# Patient Record
Sex: Female | Born: 1980 | Race: White | Hispanic: No | Marital: Single | Smoking: Never smoker
Health system: Southern US, Community
[De-identification: ages and names within clinical notes are randomized; demographics above are authoritative.]

## PROBLEM LIST (undated history)

## (undated) DIAGNOSIS — E079 Disorder of thyroid, unspecified: Secondary | ICD-10-CM

## (undated) DIAGNOSIS — J302 Other seasonal allergic rhinitis: Secondary | ICD-10-CM

## (undated) DIAGNOSIS — J45909 Unspecified asthma, uncomplicated: Secondary | ICD-10-CM

## (undated) DIAGNOSIS — K219 Gastro-esophageal reflux disease without esophagitis: Secondary | ICD-10-CM

## (undated) DIAGNOSIS — E785 Hyperlipidemia, unspecified: Secondary | ICD-10-CM

## (undated) HISTORY — DX: Gastro-esophageal reflux disease without esophagitis: K21.9

## (undated) HISTORY — DX: Other seasonal allergic rhinitis: J30.2

## (undated) HISTORY — PX: MASTOIDECTOMY: SHX711

## (undated) HISTORY — DX: Disorder of thyroid, unspecified: E07.9

## (undated) HISTORY — DX: Hyperlipidemia, unspecified: E78.5

---
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
## 2012-07-08 LAB — CBC AND DIFFERENTIAL
HCT: 40 (ref 36–46)
Hemoglobin: 13.4 (ref 12.0–16.0)
Platelets: 167 (ref 150–399)

## 2012-07-08 LAB — LIPID PANEL: CHOLESTEROL: 214 — AB (ref 0–200)

## 2012-07-08 LAB — BASIC METABOLIC PANEL
Glucose: 77
POTASSIUM: 4.2 (ref 3.4–5.3)
Sodium: 141 (ref 137–147)

## 2012-07-08 LAB — HEPATIC FUNCTION PANEL
ALK PHOS: 61 (ref 25–125)
ALT: 96 — AB (ref 7–35)
AST: 40 — AB (ref 13–35)
Alkaline Phosphatase: 61 (ref 25–125)

## 2012-07-08 LAB — TSH: TSH: 1.56 (ref <=5.90)

## 2012-08-09 LAB — HM PAP SMEAR

## 2017-08-27 DIAGNOSIS — Z3042 Encounter for surveillance of injectable contraceptive: Secondary | ICD-10-CM | POA: Diagnosis not present

## 2017-10-29 ENCOUNTER — Ambulatory Visit: Payer: Self-pay | Admitting: Family Medicine

## 2017-11-14 DIAGNOSIS — Z3042 Encounter for surveillance of injectable contraceptive: Secondary | ICD-10-CM | POA: Diagnosis not present

## 2017-11-14 DIAGNOSIS — H66002 Acute suppurative otitis media without spontaneous rupture of ear drum, left ear: Secondary | ICD-10-CM | POA: Diagnosis not present

## 2017-12-22 DIAGNOSIS — J029 Acute pharyngitis, unspecified: Secondary | ICD-10-CM | POA: Diagnosis not present

## 2018-01-11 ENCOUNTER — Ambulatory Visit (HOSPITAL_COMMUNITY)
Admission: EM | Admit: 2018-01-11 | Discharge: 2018-01-11 | Disposition: A | Discharge status: Home / Self Care | Payer: BLUE CROSS/BLUE SHIELD | Attending: Family Medicine | Admitting: Family Medicine

## 2018-01-11 ENCOUNTER — Encounter (HOSPITAL_COMMUNITY): Payer: Self-pay | Admitting: Family Medicine

## 2018-01-11 DIAGNOSIS — J0100 Acute maxillary sinusitis, unspecified: Secondary | ICD-10-CM

## 2018-01-11 HISTORY — DX: Unspecified asthma, uncomplicated: J45.909

## 2018-01-11 MED ORDER — FLUCONAZOLE 150 MG PO TABS: 150.0000 mg | ORAL_TABLET | Freq: Every day | ORAL | 0 refills | Status: AC

## 2018-01-11 MED ORDER — AMOXICILLIN-POT CLAVULANATE 875-125 MG PO TABS: 1.0000 | ORAL_TABLET | Freq: Two times a day (BID) | ORAL | 0 refills | Status: AC

## 2018-01-11 NOTE — ED Provider Notes (Signed)
[facility]    CSN: [id number] Arrival date & time: 01/11/18  1148     History   Chief Complaint Chief Complaint  Patient presents with  . Sore Throat  . Otalgia    HPI Denise Keller is a 37 y.o. female.   37 y.o. Female, presenting today for 2 weeks duration of coughing, congestion, bilateral otalgia, sore throat and sinus pain. She was improving but got worse again. Denies fever, CP, SOB, wheezing or abdominal pain. Reports that she has really bad allergic rhinitis. She also states that she is prone to get ear infections.       Past Medical History:  Diagnosis Date  . Asthma     There are no active problems to display for this patient.   History reviewed. No pertinent surgical history.  OB History    No data available       Home Medications    Prior to Admission medications   Medication Sig Start Date End Date Taking? Authorizing Provider  amoxicillin-clavulanate (AUGMENTIN) 875-125 MG tablet Take 1 tablet by mouth 2 (two) times daily for 7 days. 01/11/18 01/18/18  Lucia EstelleZheng, Cambell Stanek, NP  fluconazole (DIFLUCAN) 150 MG tablet Take 1 tablet (150 mg total) by mouth daily for 2 days. 01/11/18 01/13/18  Lucia EstelleZheng, Demiyah Fischbach, NP    Family History History reviewed. No pertinent family history.  Social History Social History   Tobacco Use  . Smoking status: Never Smoker  . Smokeless tobacco: Never Used  Substance Use Topics  . Alcohol use: Not on file  . Drug use: Not on file     Allergies   Biaxin [clarithromycin] and Eggs or egg-derived products   Review of Systems Review of Systems  Constitutional: Negative for chills, fatigue and fever.  HENT: Positive for congestion, ear pain, rhinorrhea, sinus pressure, sinus pain, sneezing and sore throat.   Respiratory: Positive for cough. Negative for shortness of breath and wheezing.   Cardiovascular: Negative for chest pain and palpitations.  Gastrointestinal: Negative for abdominal pain, nausea and vomiting.    Neurological: Negative for dizziness and headaches.     Physical Exam Triage Vital Signs ED Triage Vitals  Enc Vitals Group     BP 01/11/18 1253 138/77     Pulse Rate 01/11/18 1253 98     Resp 01/11/18 1253 18     Temp 01/11/18 1253 98.6 F (37 C)     Temp src --      SpO2 01/11/18 1253 100 %     Weight --      Height --      Head Circumference --      Peak Flow --      Pain Score 01/11/18 1249 5     Pain Loc --      Pain Edu? --      Excl. in GC? --    No data found.  Updated Vital Signs BP 138/77   Pulse 98   Temp 98.6 F (37 C)   Resp 18   SpO2 100%   Visual Acuity Right Eye Distance:   Left Eye Distance:   Bilateral Distance:    Right Eye Near:   Left Eye Near:    Bilateral Near:     Physical Exam  Constitutional: She is oriented to person, place, and time. She appears well-developed and well-nourished. No distress.  HENT:  Head: Normocephalic and atraumatic.  Mouth/Throat: Mucous membranes are normal.  Left Ear: Canal clear, TM perforation noted but no  erythema.  Right ear: Unremarkable.  Maxillary sinuses tender to percuss.  Pharyngeal erythema noted  Neck: Normal range of motion. Neck supple.  Cardiovascular: Normal rate and normal heart sounds.  No murmur heard. Pulmonary/Chest: Effort normal and breath sounds normal. She has no wheezes.  Abdominal: Soft. There is no tenderness.  Lymphadenopathy:    She has no cervical adenopathy.  Neurological: She is alert and oriented to person, place, and time.  Skin: Skin is warm and dry.  Psychiatric: She has a normal mood and affect.  Nursing note and vitals reviewed.    UC Treatments / Results  Labs (all labs ordered are listed, but only abnormal results are displayed) Labs Reviewed - No data to display  EKG  EKG Interpretation None       Radiology No results found.  Procedures Procedures (including critical care time)  Medications Ordered in UC Medications - No data to  display   Initial Impression / Assessment and Plan / UC Course  I have reviewed the triage vital signs and the nursing notes.  Pertinent labs & imaging results that were available during my care of the patient were reviewed by me and considered in my medical decision making (see chart for details).  Final Clinical Impressions(s) / UC Diagnoses   Final diagnoses:  Acute non-recurrent maxillary sinusitis   Prescriptions given (see below). Also continue the zyrtec and Flonase that you have at home. Reviewed directions for usage and side effects. Patient states understanding and will call with questions or problems. Patient instructed to call or follow up with his/her primary care doctor if failure to improve or change in symptoms. Discharge instruction given.   ED Discharge Orders        Ordered    amoxicillin-clavulanate (AUGMENTIN) 875-125 MG tablet  2 times daily     01/11/18 1308    fluconazole (DIFLUCAN) 150 MG tablet  Daily    Comments:  Take 1 now, repeat in 72 hrs   01/11/18 1308       Controlled Substance Prescriptions [place] Controlled Substance Registry consulted? Not Applicable   Lucia Estelle, NP 01/11/18 1313

## 2018-01-11 NOTE — ED Triage Notes (Signed)
Pt here for sore throat, cough, congestion since before christmas. sts that she was seen at [facility] after christmas and told virus. sts that she got slightly better but then her symptoms returned.

## 2018-02-04 DIAGNOSIS — Z3042 Encounter for surveillance of injectable contraceptive: Secondary | ICD-10-CM | POA: Diagnosis not present

## 2018-03-25 DIAGNOSIS — H40003 Preglaucoma, unspecified, bilateral: Secondary | ICD-10-CM | POA: Diagnosis not present

## 2018-04-22 DIAGNOSIS — Z3042 Encounter for surveillance of injectable contraceptive: Secondary | ICD-10-CM | POA: Diagnosis not present

## 2018-05-02 ENCOUNTER — Ambulatory Visit (INDEPENDENT_AMBULATORY_CARE_PROVIDER_SITE_OTHER): Payer: BLUE CROSS/BLUE SHIELD

## 2018-05-02 ENCOUNTER — Ambulatory Visit (INDEPENDENT_AMBULATORY_CARE_PROVIDER_SITE_OTHER): Payer: BLUE CROSS/BLUE SHIELD | Admitting: Podiatry

## 2018-05-02 ENCOUNTER — Encounter: Payer: Self-pay | Admitting: Podiatry

## 2018-05-02 DIAGNOSIS — M722 Plantar fascial fibromatosis: Secondary | ICD-10-CM | POA: Diagnosis not present

## 2018-05-02 DIAGNOSIS — M79671 Pain in right foot: Secondary | ICD-10-CM

## 2018-05-02 DIAGNOSIS — M79672 Pain in left foot: Secondary | ICD-10-CM

## 2018-05-02 MED ORDER — MELOXICAM 15 MG PO TABS: 15.0000 mg | ORAL_TABLET | Freq: Every day | ORAL | 0 refills | Status: DC

## 2018-05-02 NOTE — Progress Notes (Signed)
Subjective:   Patient ID: Denise Keller, female   DOB: 37 y.o.   MRN: [id number]   HPI 37 year old female presents the office today for concerns of continued left foot pain.  She states that she is had about 3 to 4 injections of the last 2 years.  She previously had one injection of the right foot and this did well and had no pain afterwards but she still continues some intermittent pain to the left foot.  She denies any recent injury or trauma she denies any swelling or redness.  She has braces at home she is had previously but she has not been wearing them.She has no other concerns today.   Review of Systems  All other systems reviewed and are negative.   Past Medical History:  Diagnosis Date  . Asthma     History reviewed. No pertinent surgical history.   Current Outpatient Medications:  .  azelastine (ASTELIN) 0.1 % nasal spray, , Disp: , Rfl:  .  medroxyPROGESTERone Acetate 150 MG/ML SUSY, , Disp: , Rfl:  .  meloxicam (MOBIC) 15 MG tablet, Take 1 tablet (15 mg total) by mouth daily., Disp: 30 tablet, Rfl: 0  Allergies  Allergen Reactions  . Biaxin [Clarithromycin]   . Eggs Or Egg-Derived Products     Social History   Socioeconomic History  . Marital status: Single    Spouse name: Not on file  . Number of children: Not on file  . Years of education: Not on file  . Highest education level: Not on file  Occupational History  . Not on file  Social Needs  . Financial resource strain: Not on file  . Food insecurity:    Worry: Not on file    Inability: Not on file  . Transportation needs:    Medical: Not on file    Non-medical: Not on file  Tobacco Use  . Smoking status: Never Smoker  . Smokeless tobacco: Never Used  Substance and Sexual Activity  . Alcohol use: Not on file  . Drug use: Not on file  . Sexual activity: Not on file  Lifestyle  . Physical activity:    Days per week: Not on file    Minutes per session: Not on file  . Stress: Not on file   Relationships  . Social connections:    Talks on phone: Not on file    Gets together: Not on file    Attends religious service: Not on file    Active member of club or organization: Not on file    Attends meetings of clubs or organizations: Not on file    Relationship status: Not on file  . Intimate partner violence:    Fear of current or ex partner: Not on file    Emotionally abused: Not on file    Physically abused: Not on file    Forced sexual activity: Not on file  Other Topics Concern  . Not on file  Social History Narrative  . Not on file         Objective:  Physical Exam  General: AAO x3, NAD  Dermatological: Skin is warm, dry and supple bilateral. Nails x 10 are well manicured; remaining integument appears unremarkable at this time. There are no open sores, no preulcerative lesions, no rash or signs of infection present.  Vascular: Dorsalis Pedis artery and Posterior Tibial artery pedal pulses are 2/4 bilateral with immedate capillary fill time. Pedal hair growth present. No varicosities and no lower extremity edema  present bilateral. There is no pain with calf compression, swelling, warmth, erythema.   Neruologic: Grossly intact via light touch bilateral.  Protective threshold with Semmes Wienstein monofilament intact to all pedal sites bilateral.  Negative Tinel sign.  Musculoskeletal: Tenderness to palpation along the plantar medial tubercle of the calcaneus at the insertion of plantar fascia on the left foot. There is no pain along the course of the plantar fascia within the arch of the foot. Plantar fascia appears to be intact. There is no pain with lateral compression of the calcaneus or pain with vibratory sensation. There is no pain along the course or insertion of the achilles tendon. No other areas of tenderness to bilateral lower extremities. Muscular strength 5/5 in all groups tested bilateral.  Gait: Unassisted, Nonantalgic.      Assessment:   Left foot  plantar fasciitis    Plan:  -Treatment options discussed including all alternatives, risks, and complications -Etiology of symptoms were discussed -X-rays were obtained and reviewed with the patient. No evidence of acute fracture or stress fracture. -Plantar fascial injection was performed.  See procedure note below. -Prescribed mobic. Discussed side effects of the medication and directed to stop if any are to occur and call the office.  -Plantar fascial brace dispensed -Discussed shoe gear modifications and orthotics -Stretching, icing daily. -She has a night splint at home continue with this. -Follow-up 3 weeks or sooner if needed  Vivi Barrack DPM

## 2018-05-18 ENCOUNTER — Ambulatory Visit: Payer: Self-pay | Admitting: Family Medicine

## 2018-05-27 ENCOUNTER — Ambulatory Visit (INDEPENDENT_AMBULATORY_CARE_PROVIDER_SITE_OTHER): Payer: BLUE CROSS/BLUE SHIELD | Admitting: Podiatry

## 2018-05-27 DIAGNOSIS — M722 Plantar fascial fibromatosis: Secondary | ICD-10-CM | POA: Diagnosis not present

## 2018-05-27 MED ORDER — MELOXICAM 15 MG PO TABS: 15.0000 mg | ORAL_TABLET | Freq: Every day | ORAL | 0 refills | Status: DC

## 2018-05-27 MED ORDER — TRIAMCINOLONE ACETONIDE 10 MG/ML IJ SUSP
10.0000 mg | Freq: Once | INTRAMUSCULAR | Status: AC
  Administered 2018-05-27: 10 mg

## 2018-05-27 NOTE — Patient Instructions (Signed)

## 2018-05-30 NOTE — Progress Notes (Signed)
Subjective: [name] presents the office today for follow-up evaluation of plantar fasciitis, heel pain left side.  She states that she is still having discomfort on the left heel.  She has some mild improvement with the injection.  She was wearing a plantar fascial brace but is causing some discomfort so she stopped wearing it.  She has 2 night splint at home but she does not like to wear them as it causes her toes to command when she wears them for long periods.  No numbness or tingling.  Pain does not wake her up at night.  No swelling. Denies any systemic complaints such as fevers, chills, nausea, vomiting. No acute changes since last appointment, and no other complaints at this time.   Objective: AAO x3, NAD DP/PT pulses palpable bilaterally, CRT less than 3 seconds Negative Tinel sign There is improved yet continued tenderness to palpation along the plantar medial tubercle of the calcaneus at the insertion of plantar fascia on the left foot. There is no pain along the course of the plantar fascia within the arch of the foot. Plantar fascia appears to be intact. There is no pain with lateral compression of the calcaneus or pain with vibratory sensation. There is no pain along the course or insertion of the achilles tendon. No other areas of tenderness to bilateral lower extremities. No open lesions or pre-ulcerative lesions.  No pain with calf compression, swelling, warmth, erythema  Assessment: Left chronic plantar fasciitis  Plan: -All treatment options discussed with the patient including all alternatives, risks, complications.  -Steroid injection was performed today.  We discussed how with a plantar fascial brace and to different locations for this will help.  She does not want to wear orthotics.  Continue stretching, icing exercises daily.  We discussed further options including PT, EPAT.  -RTC 4 weeks or sooner if needed. -Patient encouraged to call the office with any questions, concerns,  change in symptoms.   Procedure: Injection Tendon/Ligament Discussed alternatives, risks, complications and verbal consent was obtained.  Location: Right plantar fascia at the glabrous junction; medial approach. Skin Prep: Alcohol. Injectate: 0.5cc 0.5% marcaine plain, 0.5 cc 2% lidocaine plain and, 1 cc kenalog 10. Disposition: Patient tolerated procedure well. Injection site dressed with a band-aid.  Post-injection care was discussed and return precautions discussed.    Vivi BarrackMatthew R Wagoner DPM

## 2018-06-24 ENCOUNTER — Ambulatory Visit: Payer: BLUE CROSS/BLUE SHIELD | Admitting: Podiatry

## 2018-06-24 DIAGNOSIS — H40013 Open angle with borderline findings, low risk, bilateral: Secondary | ICD-10-CM | POA: Diagnosis not present

## 2018-07-08 DIAGNOSIS — Z3042 Encounter for surveillance of injectable contraceptive: Secondary | ICD-10-CM | POA: Diagnosis not present

## 2018-07-22 ENCOUNTER — Ambulatory Visit (INDEPENDENT_AMBULATORY_CARE_PROVIDER_SITE_OTHER): Payer: BLUE CROSS/BLUE SHIELD | Admitting: Podiatry

## 2018-07-22 ENCOUNTER — Telehealth: Payer: Self-pay | Admitting: *Deleted

## 2018-07-22 DIAGNOSIS — M722 Plantar fascial fibromatosis: Secondary | ICD-10-CM | POA: Diagnosis not present

## 2018-07-22 NOTE — Telephone Encounter (Signed)
-----   Message from Vivi BarrackMatthew R Wagoner, DPM sent at 07/22/2018  3:50 PM EDT ----- Can you please order an MRI of the left ankle to rule out plantar fascial tear. She has had chronic heel pain and has had several injection, stretching, oral medications without any significant relief.

## 2018-07-26 NOTE — Progress Notes (Signed)
Subjective: 37 year old female presents the office today for follow-up evaluation of left foot pain, plantar fasciitis.  She had plantar fasciitis for several years has had numerous injections.  She states the first injections were helpful but it started not be helpful.  She has been doing stretching exercises as well as using a plantar fascial brace.  She does not want to wear orthotics.  She states that she has sensory issues when she is tried wearing them previously is not uncomfortable.  She still relates intermittent discomfort. Denies any systemic complaints such as fevers, chills, nausea, vomiting. No acute changes since last appointment, and no other complaints at this time.   Objective: AAO x3, NAD DP/PT pulses palpable bilaterally, CRT less than 3 seconds There is continuation of tenderness to the plantar medial tubercle of the calcaneus and insertion of plantar fascia on the left foot.  There is no pain with lateral compression of calcaneus there is no pain to vibratory sensation.  No edema, erythema, increased warmth.  Achilles tendon, flexor, peroneal tendons intact.  No open lesions or pre-ulcerative lesions.  No pain with calf compression, swelling, warmth, erythema  Assessment: Chronic heel pain, plantar fasciitis  Plan: -All treatment options discussed with the patient including all alternatives, risks, complications.  -At this time she is had several injections therefore do not think we should continue with injections.  At this point I recommend an MRI to rule out a plantar fascial tear this is ordered today.  This is also for potential surgical planning.  Continue stretching, icing exercises at home for now.  I will see her back after the MRI or sooner if any issues are to arise.  Discussed further treatment options including physical therapy, EPAT, surgery as well.  -Patient encouraged to call the office with any questions, concerns, change in symptoms.   Vivi BarrackMatthew R Royale Swamy  DPM

## 2018-08-05 ENCOUNTER — Ambulatory Visit
Admission: RE | Admit: 2018-08-05 | Discharge: 2018-08-05 | Disposition: A | Discharge status: Home / Self Care | Payer: BLUE CROSS/BLUE SHIELD | Source: Ambulatory Visit | Attending: Podiatry | Admitting: Podiatry

## 2018-08-05 DIAGNOSIS — R600 Localized edema: Secondary | ICD-10-CM | POA: Diagnosis not present

## 2018-08-09 ENCOUNTER — Telehealth: Payer: Self-pay

## 2018-08-09 NOTE — Telephone Encounter (Signed)
-----   Message from Vivi BarrackMatthew R Wagoner, DPM sent at 08/09/2018  7:29 AM EDT ----- Shanda BumpsJessica- will you please let her know that the MRI shoes moderate plantar fasciitis. We have discussed EPAT and PT. Will you call her to let her know the MRI results and see if she wants to start this? Thanks.

## 2018-08-09 NOTE — Telephone Encounter (Signed)
Left voicemail for patient to return my phone call regarding MRI results.

## 2018-08-10 ENCOUNTER — Telehealth: Payer: Self-pay

## 2018-08-10 ENCOUNTER — Ambulatory Visit (INDEPENDENT_AMBULATORY_CARE_PROVIDER_SITE_OTHER): Payer: BLUE CROSS/BLUE SHIELD | Admitting: Family Medicine

## 2018-08-10 ENCOUNTER — Encounter: Payer: Self-pay | Admitting: Family Medicine

## 2018-08-10 VITALS — BP 124/86 | HR 89 | Temp 98.0°F | Ht 62.5 in | Wt 256.4 lb

## 2018-08-10 DIAGNOSIS — E282 Polycystic ovarian syndrome: Secondary | ICD-10-CM | POA: Diagnosis not present

## 2018-08-10 DIAGNOSIS — Z13220 Encounter for screening for lipoid disorders: Secondary | ICD-10-CM

## 2018-08-10 DIAGNOSIS — Z8639 Personal history of other endocrine, nutritional and metabolic disease: Secondary | ICD-10-CM

## 2018-08-10 DIAGNOSIS — R739 Hyperglycemia, unspecified: Secondary | ICD-10-CM

## 2018-08-10 DIAGNOSIS — Z23 Encounter for immunization: Secondary | ICD-10-CM

## 2018-08-10 DIAGNOSIS — Z114 Encounter for screening for human immunodeficiency virus [HIV]: Secondary | ICD-10-CM | POA: Diagnosis not present

## 2018-08-10 DIAGNOSIS — E8881 Metabolic syndrome: Secondary | ICD-10-CM | POA: Diagnosis not present

## 2018-08-10 DIAGNOSIS — M722 Plantar fascial fibromatosis: Secondary | ICD-10-CM

## 2018-08-10 DIAGNOSIS — R5383 Other fatigue: Secondary | ICD-10-CM

## 2018-08-10 DIAGNOSIS — R600 Localized edema: Secondary | ICD-10-CM

## 2018-08-10 DIAGNOSIS — L680 Hirsutism: Secondary | ICD-10-CM

## 2018-08-10 LAB — COMPREHENSIVE METABOLIC PANEL
ALT: 40 U/L — ABNORMAL HIGH (ref 0–35)
AST: 22 U/L (ref 0–37)
Albumin: 3.8 g/dL (ref 3.5–5.2)
Alkaline Phosphatase: 52 U/L (ref 39–117)
BUN: 10 mg/dL (ref 6–23)
CO2: 27 mEq/L (ref 19–32)
Calcium: 9.2 mg/dL (ref 8.4–10.5)
Chloride: 106 mEq/L (ref 96–112)
Creatinine, Ser: 0.77 mg/dL (ref 0.40–1.20)
GFR: 89.03 mL/min (ref >60.00)
Glucose, Bld: 92 mg/dL (ref 70–99)
Potassium: 4.2 mEq/L (ref 3.5–5.1)
Sodium: 140 mEq/L (ref 135–145)
Total Bilirubin: 1.3 mg/dL — ABNORMAL HIGH (ref 0.2–1.2)
Total Protein: 6 g/dL (ref 6.0–8.3)

## 2018-08-10 LAB — CBC WITH DIFFERENTIAL/PLATELET
Basophils Absolute: 0 10*3/uL (ref 0.0–0.1)
Basophils Relative: 0.4 % (ref 0.0–3.0)
Eosinophils Absolute: 0.1 10*3/uL (ref 0.0–0.7)
Eosinophils Relative: 1 % (ref 0.0–5.0)
HCT: 42.1 % (ref 36.0–46.0)
Hemoglobin: 14.7 g/dL (ref 12.0–15.0)
Lymphocytes Relative: 28.9 % (ref 12.0–46.0)
Lymphs Abs: 1.8 10*3/uL (ref 0.7–4.0)
MCHC: 34.8 g/dL (ref 30.0–36.0)
MCV: 89.4 fl (ref 78.0–100.0)
Monocytes Absolute: 0.5 10*3/uL (ref 0.1–1.0)
Monocytes Relative: 7.5 % (ref 3.0–12.0)
Neutro Abs: 3.9 10*3/uL (ref 1.4–7.7)
Neutrophils Relative %: 62.2 % (ref 43.0–77.0)
Platelets: 167 10*3/uL (ref 150.0–400.0)
RBC: 4.72 Mil/uL (ref 3.87–5.11)
RDW: 13.5 % (ref 11.5–15.5)
WBC: 6.3 10*3/uL (ref 4.0–10.5)

## 2018-08-10 LAB — LIPID PANEL
Cholesterol: 183 mg/dL (ref 0–200)
HDL: 36.3 mg/dL — ABNORMAL LOW (ref >39.00)
NonHDL: 147.17
Total CHOL/HDL Ratio: 5
Triglycerides: 272 mg/dL — ABNORMAL HIGH (ref 0.0–149.0)
VLDL: 54.4 mg/dL — ABNORMAL HIGH (ref 0.0–40.0)

## 2018-08-10 LAB — LDL CHOLESTEROL, DIRECT: Direct LDL: 129 mg/dL

## 2018-08-10 LAB — T4, FREE: Free T4: 1 ng/dL (ref 0.60–1.60)

## 2018-08-10 LAB — VITAMIN D 25 HYDROXY (VIT D DEFICIENCY, FRACTURES): VITD: 14.4 ng/mL — ABNORMAL LOW (ref 30.00–100.00)

## 2018-08-10 LAB — VITAMIN B12: Vitamin B-12: 201 pg/mL — ABNORMAL LOW (ref 211–911)

## 2018-08-10 LAB — HEMOGLOBIN A1C: Hgb A1c MFr Bld: 5.3 % (ref 4.6–6.5)

## 2018-08-10 LAB — TSH: TSH: 1.76 u[IU]/mL (ref 0.35–4.50)

## 2018-08-10 MED ORDER — SPIRONOLACTONE 50 MG PO TABS: 50.0000 mg | ORAL_TABLET | Freq: Every day | ORAL | 1 refills | Status: DC

## 2018-08-10 MED ORDER — AZELASTINE HCL 0.1 % NA SOLN: 2.0000 | Freq: Two times a day (BID) | NASAL | 2 refills | Status: AC

## 2018-08-10 NOTE — Progress Notes (Signed)
Denise Keller is a 37 y.o. [facility]   Patient Care Team: Helane RimaWallace, Kleo Dungee, DO as PCP - General (Family Medicine)   History of Present Illness:   Denise Keller CMA acting as scribe for Dr. Earlene PlaterWallace.  HPI: Patient comes in today to establish care with Dr. Earlene PlaterWallace. [place] is from [place] in the next year. Patient does not like to search for doctors. We will get labs today and have patient come back in next Friday for a physical.   PCOS: Patient has a history of PCOS. Patient has facial hair that she would like to get rid of. Dicussed patient starting on spirolactone. Patient has some edema in the lower legs today.   Asthma: When patient gave her plasma the nurse was listening to her lungs and heard wheezing. Patient does have asthma and takes rescue inhaler. She was prescribe the preventive inhaler, but she can't afford it.   Plasma: Patients donates plasma twice a week. When she gives the plasma her heart rate runs about in the 90's.   Ear: Patient stated that she has a lot of ear issues. Ears looks good today upon exam.   Thyroid: Patient has had nodules and cyst in the thyroid. Her last US of the thyroid back in 2013.  Health Maintenance Due  Topic Date Due  . HIV Screening  04/23/1995  . PAP SMEAR  04/22/2001  . INFLUENZA VACCINE  07/28/2018   Depression screen PHQ 2/9 08/10/2018  Decreased Interest 0  Down, Depressed, Hopeless 0  PHQ - 2 Score 0   PMHx, SurgHx, SocialHx, Medications, and Allergies were reviewed in the Visit Navigator and updated as appropriate.   Past Medical History:  Diagnosis Date  . Asthma   . GERD (gastroesophageal reflux disease)   . Hyperlipidemia   . Seasonal allergies   . Thyroid disease    Past Surgical History:  Procedure Laterality Date  . TONSILLECTOMY  2000   Family History  Problem Relation Age of Onset  . Arthritis Mother   .  Hyperlipidemia Mother   . Hypertension Mother   . Learning disabilities Mother   . Alcohol abuse Father   . COPD Father   . Heart disease Father   . Hyperlipidemia Father   . Hypertension Father   . Arthritis Sister   . Miscarriages / Stillbirths Sister   . Arthritis Maternal Grandmother   . Cancer Maternal Grandmother   . Diabetes Maternal Grandmother   . Learning disabilities Maternal Grandmother   . Stroke Maternal Grandmother   . Early death Maternal Grandfather   . Heart disease Maternal Grandfather    Social History   Tobacco Use  . Smoking status: Never Smoker  . Smokeless tobacco: Never Used  Substance Use Topics  . Alcohol use: Not on file  . Drug use: Not on file   Current Medications and Allergies:   .  albuterol (PROVENTIL HFA;VENTOLIN HFA) 108 (90 Base) MCG/ACT inhaler, Inhale q 6 (six) hours as needed for wheezing. Marland Kitchen.  azelastine (ASTELIN) 0.1 % nasal spray, Place 2 sprays into both nostrils 2 (two) times daily., Disp: 30 mL, Rfl: 2 .  Cholecalciferol (VITAMIN D3) 1000 units CAPS, Take by mouth., Disp: , Rfl:  .  Ferrous Gluconate-C-Folic Acid (IRON-C PO), Take 65 mg by mouth., Disp: , Rfl:  .  Flaxseed, Linseed, (FLAXSEED OIL PO), Take by mouth., Disp: , Rfl:  .  fluticasone (FLONASE) 50 MCG/ACT nasal spray, Place into both nostrils daily., Disp: , Rfl:  .  loratadine (CLARITIN) 10 MG tablet, Take 10 mg by mouth daily., Disp: , Rfl:  .  medroxyPROGESTERone Acetate 150 MG/ML SUSY, , Disp: , Rfl:  .  Multiple Vitamin (MULTIVITAMIN) tablet, Take 1 tablet by mouth daily., Disp: , Rfl:  .   Allergies  Allergen Reactions  . Biaxin [Clarithromycin]   . Eggs Or Egg-Derived Products    Review of Systems:   Pertinent items are noted in the HPI. Otherwise, ROS is negative.  Vitals:   Vitals:   08/10/18 0755  BP: 124/86  Pulse: 89  Temp: 98 F (36.7 C)  TempSrc: Oral  SpO2: 100%  Weight: 256 lb 6.4 oz (116.3 kg)  Height: 5' 2.5" (1.588 m)     Body mass  index is 46.15 kg/m.  Physical Exam:   Physical Exam  Constitutional: She appears well-nourished.  HENT:  Head: Normocephalic and atraumatic.  Eyes: Pupils are equal, round, and reactive to light. EOM are normal.  Neck: Normal range of motion. Neck supple.  Cardiovascular: Normal rate, regular rhythm, normal heart sounds and intact distal pulses.  Pulmonary/Chest: Effort normal.  Abdominal: Soft.  Musculoskeletal: She exhibits edema.  Skin: Skin is warm.  Facial hair at chin.  Psychiatric: She has a normal mood and affect. Her behavior is normal.  Nursing note and vitals reviewed.  Assessment and Plan:   [name] was seen today for establish care.  Diagnoses and all orders for this visit:  Insulin resistance -     Hemoglobin A1c  PCOS (polycystic ovarian syndrome) -     CBC with Differential/Platelet -     Comprehensive metabolic panel  Fatigue, unspecified type -     TSH -     T4, free -     Vitamin B12 -     VITAMIN D 25 Hydroxy (Vit-D Deficiency, Fractures)  Lipid screening -     Lipid panel  Screening for HIV without presence of risk factors -     HIV antibody  History of thyroid nodule -     US THYROID; Future  Need for prophylactic vaccination against diphtheria-tetanus-pertussis (DTP) -     Tdap vaccine greater than or equal to 7yo IM  Hirsutism -     spironolactone (ALDACTONE) 50 MG tablet; Take 1 tablet (50 mg total) by mouth daily.  Lower extremity edema -     spironolactone (ALDACTONE) 50 MG tablet; Take 1 tablet (50 mg total) by mouth daily.  Other orders -     azelastine (ASTELIN) 0.1 % nasal spray; Place 2 sprays into both nostrils 2 (two) times daily.    . Reviewed expectations re: course of current medical issues. . Discussed self-management of symptoms. . Outlined signs and symptoms indicating need for more acute intervention. . Patient verbalized understanding and all questions were answered. Marland Kitchen. Health Maintenance issues including  appropriate healthy diet, exercise, and smoking avoidance were discussed with patient. . See orders for this visit as documented in the electronic medical record. . Patient received an After Visit Summary.  CMA served as Neurosurgeonscribe during this visit. History, Physical, and Plan performed by medical provider. The above documentation has been reviewed and is accurate and complete. Helane RimaErica Britni Driscoll, D.O.   Helane RimaErica Nechemia Chiappetta, DO [place] 08/10/2018

## 2018-08-10 NOTE — Telephone Encounter (Signed)
Spoke with patient regarding MRI results.  I told her that the MRI showed moderate plantar fasciitis, and that she had the option of physical therapy or shockwave therapy.  She requested to start physical therapy.  I advised her that if physical therapy does not give her any pain relief that she could call and we would discuss epat therapy/shockwave therapy.  Referral was put in for [facility] and office location.

## 2018-08-11 LAB — HIV ANTIBODY (ROUTINE TESTING W REFLEX): HIV 1&2 Ab, 4th Generation: NONREACTIVE

## 2018-08-19 ENCOUNTER — Encounter: Payer: Self-pay | Admitting: Family Medicine

## 2018-08-19 ENCOUNTER — Ambulatory Visit (INDEPENDENT_AMBULATORY_CARE_PROVIDER_SITE_OTHER): Payer: BLUE CROSS/BLUE SHIELD | Admitting: Family Medicine

## 2018-08-19 VITALS — BP 134/80 | HR 88 | Temp 98.4°F | Ht 62.0 in | Wt 253.0 lb

## 2018-08-19 DIAGNOSIS — Z79899 Other long term (current) drug therapy: Secondary | ICD-10-CM

## 2018-08-19 DIAGNOSIS — Z0000 Encounter for general adult medical examination without abnormal findings: Secondary | ICD-10-CM | POA: Diagnosis not present

## 2018-08-19 DIAGNOSIS — R5383 Other fatigue: Secondary | ICD-10-CM | POA: Diagnosis not present

## 2018-08-19 LAB — BASIC METABOLIC PANEL
BUN: 11 mg/dL (ref 7–25)
CO2: 18 mmol/L — ABNORMAL LOW (ref 20–32)
Calcium: 9 mg/dL (ref 8.6–10.2)
Chloride: 107 mmol/L (ref 98–110)
Creat: 0.77 mg/dL (ref 0.50–1.10)
Glucose, Bld: 171 mg/dL — ABNORMAL HIGH (ref 65–99)
Potassium: 4.2 mmol/L (ref 3.5–5.3)
Sodium: 139 mmol/L (ref 135–146)

## 2018-08-19 NOTE — Progress Notes (Signed)
Subjective:    Denise Keller is a 37 y.o. female and is here for a comprehensive physical exam.  Current Outpatient Medications:  .  albuterol (PROVENTIL HFA;VENTOLIN HFA) 108 (90 Base) MCG/ACT inhaler, Inhale into the lungs every 6 (six) hours as needed for wheezing or shortness of breath., Disp: , Rfl:  .  azelastine (ASTELIN) 0.1 % nasal spray, Place 2 sprays into both nostrils 2 (two) times daily., Disp: 30 mL, Rfl: 2 .  Cholecalciferol (VITAMIN D3) 1000 units CAPS, Take by mouth., Disp: , Rfl:  .  Ferrous Gluconate-C-Folic Acid (IRON-C PO), Take 65 mg by mouth., Disp: , Rfl:  .  Flaxseed, Linseed, (FLAXSEED OIL PO), Take by mouth., Disp: , Rfl:  .  fluticasone (FLONASE) 50 MCG/ACT nasal spray, Place into both nostrils daily., Disp: , Rfl:  .  loratadine (CLARITIN) 10 MG tablet, Take 10 mg by mouth daily., Disp: , Rfl:  .  medroxyPROGESTERone Acetate 150 MG/ML SUSY, , Disp: , Rfl:  .  Multiple Vitamin (MULTIVITAMIN) tablet, Take 1 tablet by mouth daily., Disp: , Rfl:  .  spironolactone (ALDACTONE) 50 MG tablet, Take 1 tablet (50 mg total) by mouth daily., Disp: 30 tablet, Rfl: 1  Health Maintenance Due  Topic Date Due  . PAP SMEAR  04/22/2001  . INFLUENZA VACCINE  07/28/2018   PMHx, SurgHx, SocialHx, Medications, and Allergies were reviewed in the Visit Navigator and updated as appropriate.   Past Medical History:  Diagnosis Date  . Asthma   . GERD (gastroesophageal reflux disease)   . Hyperlipidemia   . Seasonal allergies   . Thyroid disease      Past Surgical History:  Procedure Laterality Date  . TONSILLECTOMY  2000     Family History  Problem Relation Age of Onset  . Arthritis Mother   . Hyperlipidemia Mother   . Hypertension Mother   . Learning disabilities Mother   . Alcohol abuse Father   . COPD Father   . Heart disease Father   . Hyperlipidemia Father   . Hypertension Father   . Arthritis Sister   . Miscarriages / Stillbirths Sister   . Arthritis  Maternal Grandmother   . Cancer Maternal Grandmother   . Diabetes Maternal Grandmother   . Learning disabilities Maternal Grandmother   . Stroke Maternal Grandmother   . Early death Maternal Grandfather   . Heart disease Maternal Grandfather     Social History   Tobacco Use  . Smoking status: Never Smoker  . Smokeless tobacco: Never Used  Substance Use Topics  . Alcohol use: Not on file  . Drug use: Not on file   Review of Systems:   Pertinent items are noted in the HPI. Otherwise, ROS is negative.  Objective:   BP 134/80   Pulse 88   Temp 98.4 F (36.9 C) (Oral)   Ht 5\' 2"  (1.575 m)   Wt 253 lb (114.8 kg)   SpO2 97%   BMI 46.27 kg/m    General appearance: alert, cooperative and appears stated age. Head: normocephalic, without obvious abnormality, atraumatic. Neck: no adenopathy, supple, symmetrical, trachea midline; thyroid not enlarged, symmetric, no tenderness/mass/nodules. Lungs: clear to auscultation bilaterally. Heart: regular rate and rhythm Abdomen: soft, non-tender; no masses,  no organomegaly. Extremities: extremities normal, atraumatic, no cyanosis or edema. Skin: skin color, texture, turgor normal, no rashes or lesions. Lymph: cervical, supraclavicular, and axillary nodes normal; no abnormal inguinal nodes palpated. Neurologic: grossly normal.  Assessment/Plan:   Denise Keller was seen today for  annual exam.  Diagnoses and all orders for this visit:  Routine physical examination  Medication management -     Basic Metabolic Panel (BMET)  Patient Counseling:   [x]     Nutrition: Stressed importance of moderation in sodium/caffeine intake, saturated fat and cholesterol, caloric balance, sufficient intake of fresh fruits, vegetables, fiber, calcium, iron, and 1 mg of folate supplement per day (for females capable of pregnancy).   [x]      Stressed the importance of regular exercise.    [x]     Substance Abuse: Discussed cessation/primary prevention of  tobacco, alcohol, or other drug use; driving or other dangerous activities under the influence; availability of treatment for abuse.    [x]      Injury prevention: Discussed safety belts, safety helmets, smoke detector, smoking near bedding or upholstery.    [x]      Sexuality: Discussed sexually transmitted diseases, partner selection, use of condoms, avoidance of unintended pregnancy  and contraceptive alternatives.    [x]     Dental health: Discussed importance of regular tooth brushing, flossing, and dental visits.   [x]      Health maintenance and immunizations reviewed. Please refer to Health maintenance section.   Helane Rima, DO [facility]

## 2018-08-20 ENCOUNTER — Encounter: Payer: Self-pay | Admitting: Family Medicine

## 2018-08-26 ENCOUNTER — Ambulatory Visit
Admission: RE | Admit: 2018-08-26 | Discharge: 2018-08-26 | Disposition: A | Discharge status: Home / Self Care | Payer: BLUE CROSS/BLUE SHIELD | Source: Ambulatory Visit | Attending: Family Medicine | Admitting: Family Medicine

## 2018-08-26 DIAGNOSIS — Z8639 Personal history of other endocrine, nutritional and metabolic disease: Secondary | ICD-10-CM

## 2018-08-26 DIAGNOSIS — E041 Nontoxic single thyroid nodule: Secondary | ICD-10-CM | POA: Diagnosis not present

## 2018-08-30 ENCOUNTER — Telehealth: Payer: Self-pay | Admitting: Family Medicine

## 2018-08-30 MED ORDER — SPIRONOLACTONE 100 MG PO TABS: 100.0000 mg | ORAL_TABLET | Freq: Every day | ORAL | 2 refills | Status: DC

## 2018-08-30 NOTE — Telephone Encounter (Signed)
Please advise on increase

## 2018-08-30 NOTE — Telephone Encounter (Signed)
Copied from CRM [id number]. Topic: Quick Communication - See Telephone Encounter >> Aug 30, 2018  9:34 AM Mare Loan F wrote: Pt is needing a refill on spironolactone showing the in crease from  50mg  to 100mg   [facility]   And she wants to know if she is supposed to be on metformin   Best number [phone]

## 2018-08-30 NOTE — Telephone Encounter (Signed)
Prescription has been sent to the pharmacy and left message on patients phone to continue the Metformin.

## 2018-08-30 NOTE — Telephone Encounter (Signed)
Patient states that she has not been on metformin for years. She is going to need a new prescription for that.

## 2018-08-30 NOTE — Telephone Encounter (Signed)
Please advise on Metformin 

## 2018-08-30 NOTE — Telephone Encounter (Signed)
Please send in 100 mg dose and tell her to continue Metformin.

## 2018-09-01 MED ORDER — METFORMIN HCL ER 500 MG PO TB24: 500.0000 mg | ORAL_TABLET | Freq: Every day | ORAL | 3 refills | Status: AC

## 2018-09-01 NOTE — Telephone Encounter (Signed)
Sent to pharmacy 

## 2018-09-01 NOTE — Addendum Note (Signed)
Addended by: Helane Rima R on: 09/01/2018 08:59 AM   Modules accepted: Orders

## 2018-09-02 DIAGNOSIS — M79672 Pain in left foot: Secondary | ICD-10-CM | POA: Diagnosis not present

## 2018-09-06 DIAGNOSIS — M25672 Stiffness of left ankle, not elsewhere classified: Secondary | ICD-10-CM | POA: Diagnosis not present

## 2018-09-06 DIAGNOSIS — M25475 Effusion, left foot: Secondary | ICD-10-CM | POA: Diagnosis not present

## 2018-09-06 DIAGNOSIS — M79672 Pain in left foot: Secondary | ICD-10-CM | POA: Diagnosis not present

## 2018-09-06 DIAGNOSIS — M25671 Stiffness of right ankle, not elsewhere classified: Secondary | ICD-10-CM | POA: Diagnosis not present

## 2018-09-09 DIAGNOSIS — M25672 Stiffness of left ankle, not elsewhere classified: Secondary | ICD-10-CM | POA: Diagnosis not present

## 2018-09-09 DIAGNOSIS — M25475 Effusion, left foot: Secondary | ICD-10-CM | POA: Diagnosis not present

## 2018-09-09 DIAGNOSIS — M25671 Stiffness of right ankle, not elsewhere classified: Secondary | ICD-10-CM | POA: Diagnosis not present

## 2018-09-09 DIAGNOSIS — M79672 Pain in left foot: Secondary | ICD-10-CM | POA: Diagnosis not present

## 2018-09-13 DIAGNOSIS — M25672 Stiffness of left ankle, not elsewhere classified: Secondary | ICD-10-CM | POA: Diagnosis not present

## 2018-09-13 DIAGNOSIS — M25475 Effusion, left foot: Secondary | ICD-10-CM | POA: Diagnosis not present

## 2018-09-13 DIAGNOSIS — M79672 Pain in left foot: Secondary | ICD-10-CM | POA: Diagnosis not present

## 2018-09-13 DIAGNOSIS — M25671 Stiffness of right ankle, not elsewhere classified: Secondary | ICD-10-CM | POA: Diagnosis not present

## 2018-09-16 DIAGNOSIS — M79672 Pain in left foot: Secondary | ICD-10-CM | POA: Diagnosis not present

## 2018-09-16 DIAGNOSIS — M25671 Stiffness of right ankle, not elsewhere classified: Secondary | ICD-10-CM | POA: Diagnosis not present

## 2018-09-16 DIAGNOSIS — M25672 Stiffness of left ankle, not elsewhere classified: Secondary | ICD-10-CM | POA: Diagnosis not present

## 2018-09-16 DIAGNOSIS — M25475 Effusion, left foot: Secondary | ICD-10-CM | POA: Diagnosis not present

## 2018-09-20 DIAGNOSIS — M25475 Effusion, left foot: Secondary | ICD-10-CM | POA: Diagnosis not present

## 2018-09-20 DIAGNOSIS — M25672 Stiffness of left ankle, not elsewhere classified: Secondary | ICD-10-CM | POA: Diagnosis not present

## 2018-09-20 DIAGNOSIS — M79672 Pain in left foot: Secondary | ICD-10-CM | POA: Diagnosis not present

## 2018-09-20 DIAGNOSIS — M25671 Stiffness of right ankle, not elsewhere classified: Secondary | ICD-10-CM | POA: Diagnosis not present

## 2018-09-23 DIAGNOSIS — M79672 Pain in left foot: Secondary | ICD-10-CM | POA: Diagnosis not present

## 2018-09-23 DIAGNOSIS — M25671 Stiffness of right ankle, not elsewhere classified: Secondary | ICD-10-CM | POA: Diagnosis not present

## 2018-09-23 DIAGNOSIS — M25672 Stiffness of left ankle, not elsewhere classified: Secondary | ICD-10-CM | POA: Diagnosis not present

## 2018-09-23 DIAGNOSIS — M25475 Effusion, left foot: Secondary | ICD-10-CM | POA: Diagnosis not present

## 2018-09-23 DIAGNOSIS — Z3042 Encounter for surveillance of injectable contraceptive: Secondary | ICD-10-CM | POA: Diagnosis not present

## 2018-09-26 IMAGING — MR MR ANKLE*L* W/O CM
6 series · 40 of 40 positions shown · non-contrast
Comparison: None.

CLINICAL DATA: Left heel pain for 6 years. History of plantar
fasciitis.

EXAM:
MRI OF THE LEFT ANKLE WITHOUT CONTRAST
TECHNIQUE: Multiplanar, multisequence MR imaging of the ankle was performed. No
intravenous contrast was administered.

[Series 4: T2 fat-sat · axial · 3.0mm · 0.50mm/px · z∈[-80,+60]mm · 8 of 36 slices shown (1 of 3)]
[im 1/36]
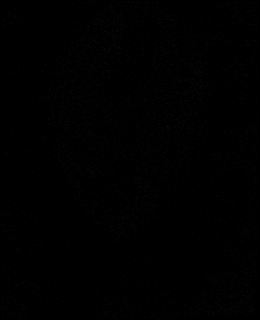
[im 6/36]
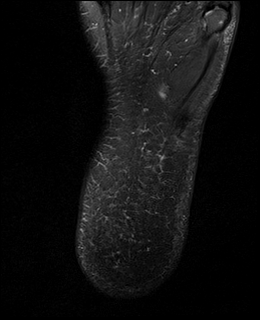
[im 11/36]
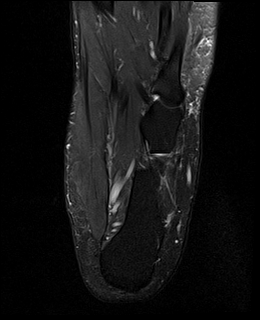
[im 16/36]
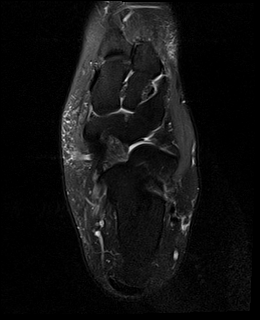
[im 21/36]
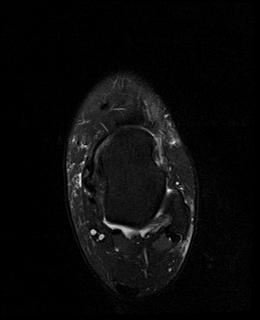
[im 26/36]
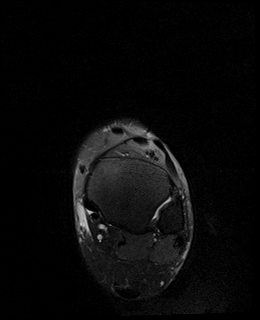
[im 31/36]
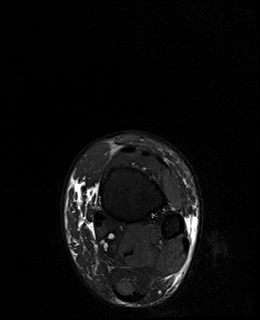
[im 36/36]
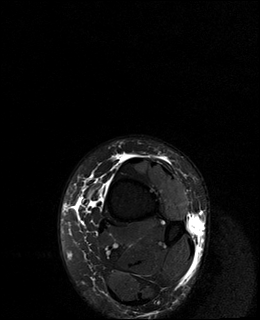

[Series 5: PD fat-sat · axial · 3.5mm · 0.47mm/px · z∈[-74,+53]mm · 7 of 30 slices shown]
[im 1/30]
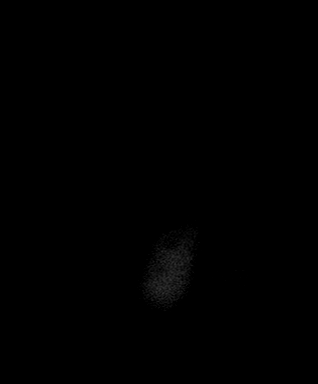
[im 5/30]
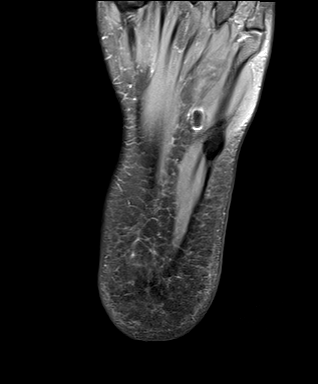
[im 10/30]
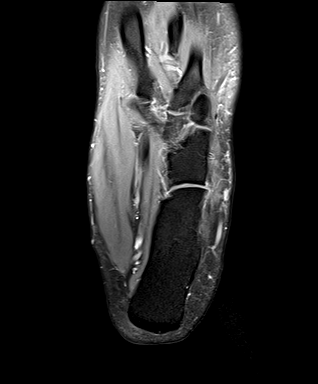
[im 15/30]
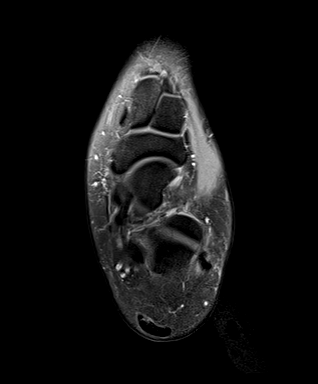
[im 20/30]
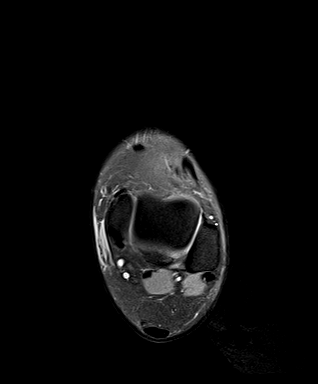
[im 25/30]
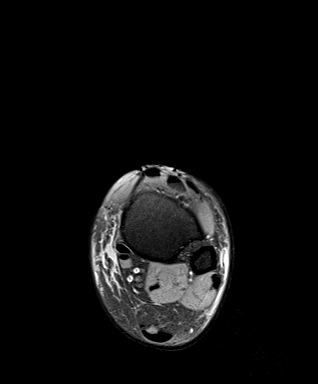
[im 30/30]
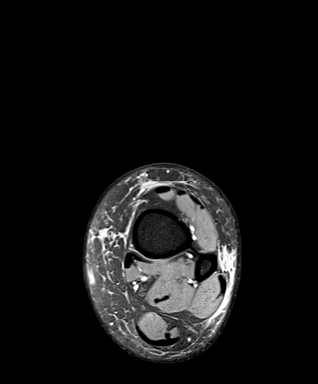

[Series 6: T2 fat-sat · coronal · 3.0mm · 0.62mm/px · 9 of 39 slices shown (2 of 3)]
[im 1/39]
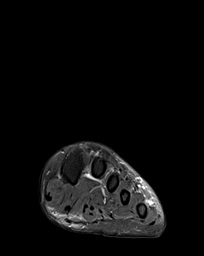
[im 5/39]
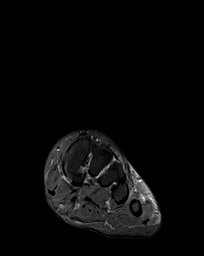
[im 10/39]
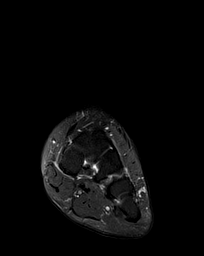
[im 15/39]
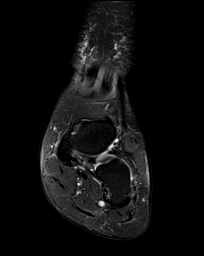
[im 20/39]
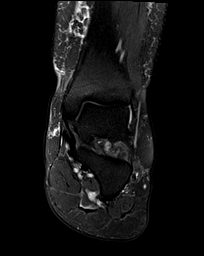
[im 24/39]
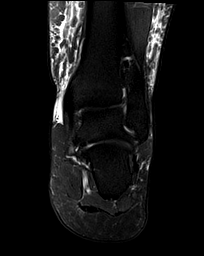
[im 29/39]
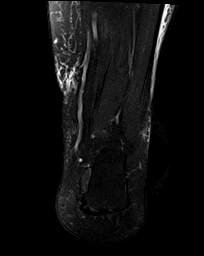
[im 34/39]
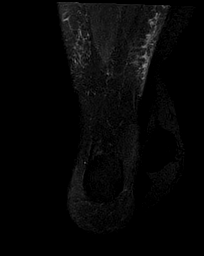
[im 39/39]
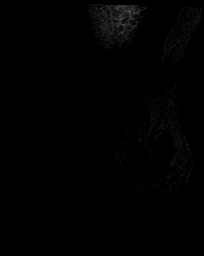

[Series 7: T1 · sagittal · 4.0mm · 0.56mm/px · 4 of 19 slices shown]
[im 1/19]
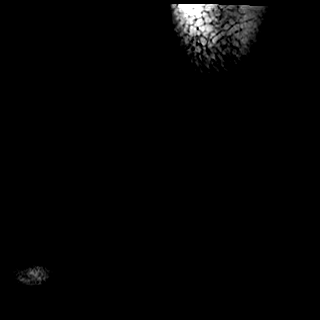
[im 7/19]
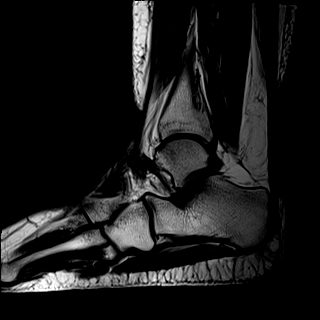
[im 13/19]
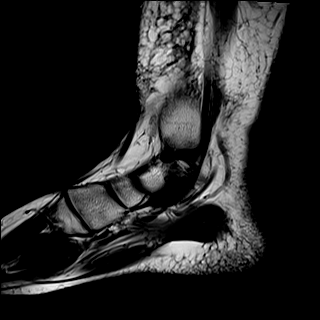
[im 19/19]
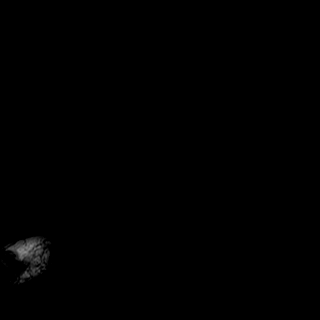

[Series 8: STIR · sagittal · 4.0mm · 0.70mm/px · 4 of 19 slices shown]
[im 1/19]
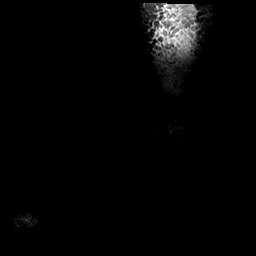
[im 7/19]
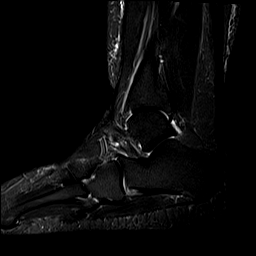
[im 13/19]
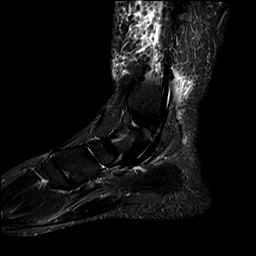
[im 19/19]
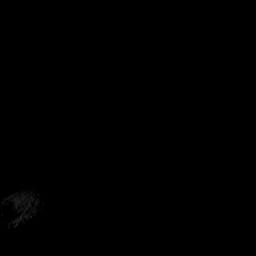

[Series 9: T2 fat-sat · coronal · 3.0mm · 0.62mm/px · 8 of 38 slices shown (3 of 3)]
[im 1/38]
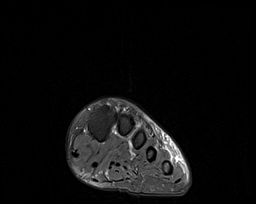
[im 6/38]
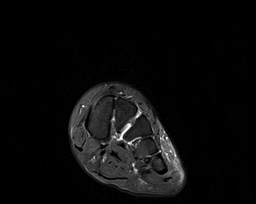
[im 11/38]
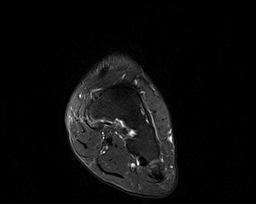
[im 16/38]
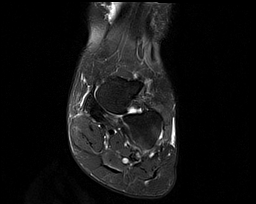
[im 22/38]
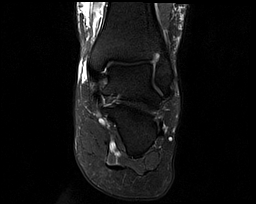
[im 27/38]
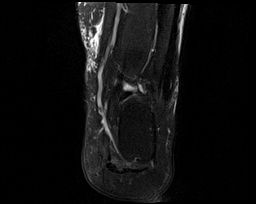
[im 32/38]
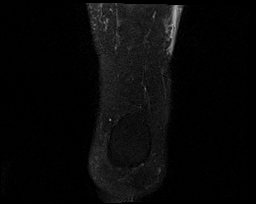
[im 38/38]
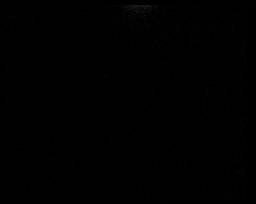

[40 of 40 positions shown; findings below may reference images not displayed]

FINDINGS: TENDONS

Peroneal: Peroneal longus tendon intact. Peroneal brevis intact.

Posteromedial: Posterior tibial tendon intact. Flexor hallucis
longus tendon intact. Flexor digitorum longus tendon intact.

Anterior: Tibialis anterior tendon intact. Extensor hallucis longus
tendon intact Extensor digitorum longus tendon intact.

Achilles:  Intact.

Plantar Fascia: Thickening of medial band of the plantar fascia
adjacent to the calcaneal insertion most consistent with plantar
fasciitis. No discrete tear.

LIGAMENTS

Lateral: Anterior talofibular ligament intact. Calcaneofibular
ligament intact. Posterior talofibular ligament intact. Anterior and
posterior tibiofibular ligaments intact.

Medial: Deltoid ligament intact. Spring ligament intact.

CARTILAGE

Ankle Joint: No joint effusion. Normal ankle mortise. No chondral
defect.

Subtalar Joints/Sinus Tarsi: Normal subtalar joints. No subtalar
joint effusion. Normal sinus tarsi.

Bones: No marrow signal abnormality.  No fracture or dislocation.

Soft Tissue: No soft tissue mass. No fluid collection or hematoma.
Soft tissue edema in the medial and lateral aspect of left distal
lower leg.
IMPRESSION: 1. Moderate plantar fasciitis of the medial band of the plantar
fascia adjacent to the calcaneus.

## 2018-09-27 DIAGNOSIS — M25475 Effusion, left foot: Secondary | ICD-10-CM | POA: Diagnosis not present

## 2018-09-27 DIAGNOSIS — M79672 Pain in left foot: Secondary | ICD-10-CM | POA: Diagnosis not present

## 2018-09-27 DIAGNOSIS — M25671 Stiffness of right ankle, not elsewhere classified: Secondary | ICD-10-CM | POA: Diagnosis not present

## 2018-09-27 DIAGNOSIS — M25672 Stiffness of left ankle, not elsewhere classified: Secondary | ICD-10-CM | POA: Diagnosis not present

## 2018-10-04 DIAGNOSIS — M25672 Stiffness of left ankle, not elsewhere classified: Secondary | ICD-10-CM | POA: Diagnosis not present

## 2018-10-04 DIAGNOSIS — M25671 Stiffness of right ankle, not elsewhere classified: Secondary | ICD-10-CM | POA: Diagnosis not present

## 2018-10-04 DIAGNOSIS — M79672 Pain in left foot: Secondary | ICD-10-CM | POA: Diagnosis not present

## 2018-10-04 DIAGNOSIS — M25475 Effusion, left foot: Secondary | ICD-10-CM | POA: Diagnosis not present

## 2018-10-11 LAB — CORTISOL-AM, BLOOD: .: 0.6

## 2018-10-11 LAB — TESTOSTERONE, BIOAVAILABLE (M): "                             .": 102

## 2018-10-14 DIAGNOSIS — M25671 Stiffness of right ankle, not elsewhere classified: Secondary | ICD-10-CM | POA: Diagnosis not present

## 2018-10-14 DIAGNOSIS — M79672 Pain in left foot: Secondary | ICD-10-CM | POA: Diagnosis not present

## 2018-10-14 DIAGNOSIS — M25475 Effusion, left foot: Secondary | ICD-10-CM | POA: Diagnosis not present

## 2018-10-14 DIAGNOSIS — M25672 Stiffness of left ankle, not elsewhere classified: Secondary | ICD-10-CM | POA: Diagnosis not present

## 2018-10-18 DIAGNOSIS — M79672 Pain in left foot: Secondary | ICD-10-CM | POA: Diagnosis not present

## 2018-10-18 DIAGNOSIS — M25671 Stiffness of right ankle, not elsewhere classified: Secondary | ICD-10-CM | POA: Diagnosis not present

## 2018-10-18 DIAGNOSIS — M25475 Effusion, left foot: Secondary | ICD-10-CM | POA: Diagnosis not present

## 2018-10-18 DIAGNOSIS — M25672 Stiffness of left ankle, not elsewhere classified: Secondary | ICD-10-CM | POA: Diagnosis not present

## 2018-10-21 DIAGNOSIS — M25475 Effusion, left foot: Secondary | ICD-10-CM | POA: Diagnosis not present

## 2018-10-21 DIAGNOSIS — M25671 Stiffness of right ankle, not elsewhere classified: Secondary | ICD-10-CM | POA: Diagnosis not present

## 2018-10-21 DIAGNOSIS — M79672 Pain in left foot: Secondary | ICD-10-CM | POA: Diagnosis not present

## 2018-10-21 DIAGNOSIS — M25672 Stiffness of left ankle, not elsewhere classified: Secondary | ICD-10-CM | POA: Diagnosis not present

## 2018-11-21 ENCOUNTER — Ambulatory Visit (INDEPENDENT_AMBULATORY_CARE_PROVIDER_SITE_OTHER): Payer: BLUE CROSS/BLUE SHIELD | Admitting: Podiatry

## 2018-11-21 ENCOUNTER — Ambulatory Visit (INDEPENDENT_AMBULATORY_CARE_PROVIDER_SITE_OTHER): Payer: BLUE CROSS/BLUE SHIELD

## 2018-11-21 DIAGNOSIS — S86302A Unspecified injury of muscle(s) and tendon(s) of peroneal muscle group at lower leg level, left leg, initial encounter: Secondary | ICD-10-CM | POA: Diagnosis not present

## 2018-11-21 DIAGNOSIS — M722 Plantar fascial fibromatosis: Secondary | ICD-10-CM

## 2018-11-21 MED ORDER — MELOXICAM 15 MG PO TABS: 15.0000 mg | ORAL_TABLET | Freq: Every day | ORAL | 0 refills | Status: DC

## 2018-11-22 NOTE — Progress Notes (Signed)
Subjective: 37 year old female presents the office today for concerns of pain more to the outside aspect of her left foot.  She says the heel is doing well she is having no pain to the heel.  She states that she fell a couple weeks ago she has developed pain the onset aspect of the foot.  She has an occasional sharp, shooting pain she points the lateral aspect of the ankle.  She has some similar symptoms in the same area in the right side but not a significant left side.  She has no other concerns.  Denies any systemic complaints such as fevers, chills, nausea, vomiting. No acute changes since last appointment, and no other complaints at this time.   Objective: AAO x3, NAD DP/PT pulses palpable bilaterally, CRT less than 3 seconds There is some mild tenderness palpation on the lateral aspect ankle directly along the peroneal tendon just posterior to the lateral malleolus on the left side more significant on the right side.  There is minimal edema there is no erythema or warmth.  Tendon appears to be intact.  Achilles tendon appears to be intact.  There is no pain to the plantar fascia course or along insertion.  No edema otherwise there is no erythema. Negative Tinel sign. No open lesions or pre-ulcerative lesions.  No pain with calf compression, swelling, warmth, erythema  Assessment: Peroneal tendinitis left side worse than right  Plan: -All treatment options discussed with the patient including all alternatives, risks, complications.  -X-rays were obtained reviewed.  No evidence of acute fracture or stress fracture. -Prescribed mobic. Discussed side effects of the medication and directed to stop if any are to occur and call the office.  -Tri-Lock ankle brace dispensed. -She is interested in getting orthotics.  She was seen today by Raiford Nobleick to get measured for orthotics.  We talked about this previously. -Patient encouraged to call the office with any questions, concerns, change in symptoms.    Return in about 3 weeks (around 12/12/2018).  Vivi BarrackMatthew R Ranferi Clingan DPM

## 2018-11-27 ENCOUNTER — Other Ambulatory Visit: Payer: Self-pay | Admitting: Family Medicine

## 2018-12-02 ENCOUNTER — Ambulatory Visit: Payer: BLUE CROSS/BLUE SHIELD | Admitting: Family Medicine

## 2018-12-02 ENCOUNTER — Ambulatory Visit (HOSPITAL_COMMUNITY)
Admission: EM | Admit: 2018-12-02 | Discharge: 2018-12-02 | Disposition: A | Discharge status: Home / Self Care | Payer: BLUE CROSS/BLUE SHIELD | Attending: Family Medicine | Admitting: Family Medicine

## 2018-12-02 ENCOUNTER — Encounter (HOSPITAL_COMMUNITY): Payer: Self-pay | Admitting: Emergency Medicine

## 2018-12-02 DIAGNOSIS — J0100 Acute maxillary sinusitis, unspecified: Secondary | ICD-10-CM | POA: Diagnosis not present

## 2018-12-02 MED ORDER — AMOXICILLIN-POT CLAVULANATE 875-125 MG PO TABS: 1.0000 | ORAL_TABLET | Freq: Two times a day (BID) | ORAL | 0 refills | Status: AC

## 2018-12-02 NOTE — Discharge Instructions (Signed)
Rest and push fluids °Augmentin prescribed.  Take as directed and to completion °Continue with OTC ibuprofen/tylenol as needed for pain °Follow up with PCP if symptoms persists °Return or go to the ED if you have any new or worsening symptoms such as fever, chills, worsening sinus pain/pressure, cough, sore throat, chest pain, shortness of breath, abdominal pain, changes in bowel or bladder habits, etc...  °

## 2018-12-02 NOTE — ED Triage Notes (Signed)
Pt here for URI sx and congestion 

## 2018-12-02 NOTE — ED Provider Notes (Addendum)
[facility]   [id number] 12/02/18 Arrival Time: 1156   CC: Sinus congestion  SUBJECTIVE: History from: patient.  Denise Keller is a 37 y.o. female hx significant for asthma, and s/p LT side mastoidectomy, who presents with abrupt onset of sinus congestion, PND, and mild cough x 7 days.  Admits to positive sick exposure or precipitating event.  Has tried sudafed without relief.  Denies aggravating factors.  Denies previous symptoms in the past. Complains of chronic left sided ear pain and decreased hearing, and mild sore throat. Denies fever, chills, fatigue,SOB, wheezing, chest pain, nausea, changes in bowel or bladder habits.     ROS: As per HPI.  Past Medical History:  Diagnosis Date  . Asthma   . GERD (gastroesophageal reflux disease)   . Hyperlipidemia   . Seasonal allergies   . Thyroid disease    Past Surgical History:  Procedure Laterality Date  . MASTOIDECTOMY Left   . TONSILLECTOMY  2000   Allergies  Allergen Reactions  . Biaxin [Clarithromycin]   . Eggs Or Egg-Derived Products    No current facility-administered medications on file prior to encounter.    Current Outpatient Medications on File Prior to Encounter  Medication Sig Dispense Refill  . albuterol (PROVENTIL HFA;VENTOLIN HFA) 108 (90 Base) MCG/ACT inhaler Inhale into the lungs every 6 (six) hours as needed for wheezing or shortness of breath.    . Ascorbic Acid (VITAMIN C) 1000 MG tablet Take 1,000 mg by mouth daily.    Marland Kitchen azelastine (ASTELIN) 0.1 % nasal spray Place 2 sprays into both nostrils 2 (two) times daily. 30 mL 2  . b complex vitamins tablet Take 1 tablet by mouth daily.    . Cholecalciferol (VITAMIN D3) 1000 units CAPS Take by mouth.    . Ferrous Gluconate-C-Folic Acid (IRON-C PO) Take 65 mg by mouth.    . Flaxseed, Linseed, (FLAXSEED OIL PO) Take by mouth.    . fluticasone (FLONASE) 50 MCG/ACT nasal spray Place into both nostrils daily.    Marland Kitchen loratadine (CLARITIN) 10 MG tablet Take  10 mg by mouth daily.    . medroxyPROGESTERone Acetate 150 MG/ML SUSY     . meloxicam (MOBIC) 15 MG tablet Take 1 tablet (15 mg total) by mouth daily. 30 tablet 0  . metFORMIN (GLUCOPHAGE-XR) 500 MG 24 hr tablet Take 1 tablet (500 mg total) by mouth at bedtime. 90 tablet 3  . Multiple Vitamin (MULTIVITAMIN) tablet Take 1 tablet by mouth daily.    Marland Kitchen spironolactone (ALDACTONE) 100 MG tablet TAKE 1 TABLET BY MOUTH ONCE DAILY 30 tablet 2  . Turmeric 500 MG CAPS Take by mouth.     Social History   Socioeconomic History  . Marital status: Single    Spouse name: Not on file  . Number of children: Not on file  . Years of education: Not on file  . Highest education level: Not on file  Occupational History  . Not on file  Social Needs  . Financial resource strain: Not on file  . Food insecurity:    Worry: Not on file    Inability: Not on file  . Transportation needs:    Medical: Not on file    Non-medical: Not on file  Tobacco Use  . Smoking status: Never Smoker  . Smokeless tobacco: Never Used  Substance and Sexual Activity  . Alcohol use: Not on file  . Drug use: Not on file  . Sexual activity: Not on file  Lifestyle  . Physical  activity:    Days per week: Not on file    Minutes per session: Not on file  . Stress: Not on file  Relationships  . Social connections:    Talks on phone: Not on file    Gets together: Not on file    Attends religious service: Not on file    Active member of club or organization: Not on file    Attends meetings of clubs or organizations: Not on file    Relationship status: Not on file  . Intimate partner violence:    Fear of current or ex partner: Not on file    Emotionally abused: Not on file    Physically abused: Not on file    Forced sexual activity: Not on file  Other Topics Concern  . Not on file  Social History Narrative  . Not on file   Family History  Problem Relation Age of Onset  . Arthritis Mother   . Hyperlipidemia Mother   .  Hypertension Mother   . Learning disabilities Mother   . Alcohol abuse Father   . COPD Father   . Heart disease Father   . Hyperlipidemia Father   . Hypertension Father   . Arthritis Sister   . Miscarriages / Stillbirths Sister   . Arthritis Maternal Grandmother   . Cancer Maternal Grandmother   . Diabetes Maternal Grandmother   . Learning disabilities Maternal Grandmother   . Stroke Maternal Grandmother   . Early death Maternal Grandfather   . Heart disease Maternal Grandfather     OBJECTIVE:  Vitals:   12/02/18 1236  BP: (!) 150/91  Pulse: (!) 110  Resp: 18  Temp: (!) 97.1 F (36.2 C)  TempSrc: Oral  SpO2: 95%     General appearance: alert; appears fatigued, but nontoxic; speaking in full sentences and tolerating own secretions HEENT: NCAT; Ears: EACs clear, RT TM pearly gray, LT TM with significant scarring; Eyes: PERRL.  EOM grossly intact. Sinuses: maxillary sinus tenderness; Nose: nares patent without rhinorrhea, Throat: oropharynx clear, tonsils non erythematous or enlarged, uvula midline  Neck: supple without LAD Lungs: unlabored respirations, symmetrical air entry; cough: mild; no respiratory distress; CTAB Heart: regular rate and rhythm.  Radial pulses 2+ symmetrical bilaterally Skin: warm and dry Psychological: alert and cooperative; normal mood and affect  ASSESSMENT & PLAN:  1. Acute non-recurrent maxillary sinusitis     Meds ordered this encounter  Medications  . amoxicillin-clavulanate (AUGMENTIN) 875-125 MG tablet    Sig: Take 1 tablet by mouth every 12 (twelve) hours for 10 days.    Dispense:  20 tablet    Refill:  0    Order Specific Question:   Supervising Provider    Answer:   Eustace MooreELSON, YVONNE SUE [[id number]]   Rest and push fluids Augmentin prescribed.  Take as directed and to completion Continue with OTC ibuprofen/tylenol as needed for pain Follow up with PCP if symptoms persists Return or go to the ED if you have any new or worsening  symptoms such as fever, chills, worsening sinus pain/pressure, cough, sore throat, chest pain, shortness of breath, abdominal pain, changes in bowel or bladder habits, etc...   Reviewed expectations re: course of current medical issues. Questions answered. Outlined signs and symptoms indicating need for more acute intervention. Patient verbalized understanding. After Visit Summary given.         Rennis HardingWurst, Alazne Quant, PA-C 12/02/18 1304    Alvino ChapelWurst, FriendshipBrittany, PA-C 12/02/18 1304

## 2018-12-09 ENCOUNTER — Encounter: Payer: Self-pay | Admitting: Family Medicine

## 2018-12-09 ENCOUNTER — Ambulatory Visit (INDEPENDENT_AMBULATORY_CARE_PROVIDER_SITE_OTHER): Payer: BLUE CROSS/BLUE SHIELD | Admitting: Family Medicine

## 2018-12-09 VITALS — BP 140/80 | HR 105 | Temp 98.1°F | Ht 62.0 in | Wt 255.2 lb

## 2018-12-09 DIAGNOSIS — J302 Other seasonal allergic rhinitis: Secondary | ICD-10-CM

## 2018-12-09 DIAGNOSIS — R7989 Other specified abnormal findings of blood chemistry: Secondary | ICD-10-CM

## 2018-12-09 DIAGNOSIS — E538 Deficiency of other specified B group vitamins: Secondary | ICD-10-CM

## 2018-12-09 DIAGNOSIS — R5383 Other fatigue: Secondary | ICD-10-CM | POA: Diagnosis not present

## 2018-12-09 DIAGNOSIS — E8881 Metabolic syndrome: Secondary | ICD-10-CM

## 2018-12-09 DIAGNOSIS — Z309 Encounter for contraceptive management, unspecified: Secondary | ICD-10-CM

## 2018-12-09 DIAGNOSIS — E282 Polycystic ovarian syndrome: Secondary | ICD-10-CM | POA: Diagnosis not present

## 2018-12-09 DIAGNOSIS — R945 Abnormal results of liver function studies: Secondary | ICD-10-CM

## 2018-12-09 DIAGNOSIS — E559 Vitamin D deficiency, unspecified: Secondary | ICD-10-CM

## 2018-12-09 DIAGNOSIS — E88819 Insulin resistance, unspecified: Secondary | ICD-10-CM

## 2018-12-09 DIAGNOSIS — R600 Localized edema: Secondary | ICD-10-CM

## 2018-12-09 MED ORDER — MEDROXYPROGESTERONE ACETATE 150 MG/ML IM SUSP
150.0000 mg | Freq: Once | INTRAMUSCULAR | Status: AC
  Administered 2018-12-09: 150 mg via INTRAMUSCULAR

## 2018-12-09 NOTE — Progress Notes (Signed)
Denise Keller is a 37 y.o. female is here for follow up.  History of Present Illness:   HPI: Patient in for follow up. Tolerating new medication changes. Trying to increase water intake. LE edema much improved. Still working on [employer] diet choices. Not exercising. Taking supplements with no issues. Due for lab recheck. Due for DepoProvera injection.   Health Maintenance Due  Topic Date Due  . PAP SMEAR-Modifier  08/10/2015   Depression screen PHQ 2/9 08/10/2018  Decreased Interest 0  Down, Depressed, Hopeless 0  PHQ - 2 Score 0   PMHx, SurgHx, SocialHx, FamHx, Medications, and Allergies were reviewed in the Visit Navigator and updated as appropriate.   Patient Active Problem List   Diagnosis Date Noted  . Insulin resistance 12/10/2018  . PCOS (polycystic ovarian syndrome) 12/10/2018  . Encounter for contraceptive management 12/10/2018  . Elevated liver function tests 12/10/2018  . Lower extremity edema 12/10/2018  . Vitamin D deficiency 12/10/2018  . B12 deficiency 12/10/2018  . Seasonal allergies 12/10/2018  . Plantar fasciitis 05/02/2018   Social History   Tobacco Use  . Smoking status: Never Smoker  . Smokeless tobacco: Never Used  Substance Use Topics  . Alcohol use: Not on file  . Drug use: Not on file   Current Medications and Allergies:   .  albuterol (PROVENTIL HFA;VENTOLIN HFA) 108 (90 Base) MCG/ACT inhaler, Inhale into the lungs every 6 (six) hours as needed for wheezing or shortness of breath., Disp: , Rfl:  .  Ascorbic Acid (VITAMIN C) 1000 MG tablet, Take 1,000 mg by mouth daily., Disp: , Rfl:  .  azelastine (ASTELIN) 0.1 % nasal spray, Place 2 sprays into both nostrils 2 (two) times daily., Disp: 30 mL, Rfl: 2 .  b complex vitamins tablet, Take 1 tablet by mouth daily., Disp: , Rfl:  .  Cholecalciferol (VITAMIN D3) 1000 units CAPS, Take by mouth., Disp: , Rfl:  .  Ferrous Gluconate-C-Folic Acid (IRON-C PO), Take 65 mg by mouth., Disp: , Rfl:  .   Flaxseed, Linseed, (FLAXSEED OIL PO), Take by mouth., Disp: , Rfl:  .  fluticasone (FLONASE) 50 MCG/ACT nasal spray, Place into both nostrils daily., Disp: , Rfl:  .  loratadine (CLARITIN) 10 MG tablet, Take 10 mg by mouth daily., Disp: , Rfl:  .  medroxyPROGESTERone Acetate 150 MG/ML SUSY, , Disp: , Rfl:  .  meloxicam (MOBIC) 15 MG tablet, Take 1 tablet (15 mg total) by mouth daily., Disp: 30 tablet, Rfl: 0 .  metFORMIN (GLUCOPHAGE-XR) 500 MG 24 hr tablet, Take 1 tablet (500 mg total) by mouth at bedtime., Disp: 90 tablet, Rfl: 3 .  Multiple Vitamin (MULTIVITAMIN) tablet, Take 1 tablet by mouth daily., Disp: , Rfl:  .  spironolactone (ALDACTONE) 100 MG tablet, TAKE 1 TABLET BY MOUTH ONCE DAILY, Disp: 30 tablet, Rfl: 2 .  Turmeric 500 MG CAPS, Take by mouth., Disp: , Rfl:    Allergies  Allergen Reactions  . Biaxin [Clarithromycin]   . Eggs Or Egg-Derived Products    Review of Systems   Pertinent items are noted in the HPI. Otherwise, a complete ROS is negative.  Vitals:   Vitals:   12/09/18 1420  BP: 140/80  Pulse: (!) 105  Temp: 98.1 F (36.7 C)  TempSrc: Oral  SpO2: 99%  Weight: 255 lb 4 oz (115.8 kg)  Height: 5\' 2"  (1.575 m)     Body mass index is 46.69 kg/m.  Physical Exam:   Physical Exam Vitals signs and  nursing note reviewed.  HENT:     Head: Normocephalic and atraumatic.  Eyes:     Pupils: Pupils are equal, round, and reactive to light.  Neck:     Musculoskeletal: Normal range of motion and neck supple.  Cardiovascular:     Rate and Rhythm: Normal rate and regular rhythm.     Heart sounds: Normal heart sounds.  Pulmonary:     Effort: Pulmonary effort is normal.  Abdominal:     Palpations: Abdomen is soft.  Skin:    General: Skin is warm.  Psychiatric:        Behavior: Behavior normal.     Results for orders placed or performed in visit on 12/09/18  CBC with Differential/Platelet  Result Value Ref Range   WBC 8.4 3.8 - 10.8 Thousand/uL   RBC 4.63  3.80 - 5.10 Million/uL   Hemoglobin 14.5 11.7 - 15.5 g/dL   HCT 52.8 41.3 - 24.4 %   MCV 90.7 80.0 - 100.0 fL   MCH 31.3 27.0 - 33.0 pg   MCHC 34.5 32.0 - 36.0 g/dL   RDW 01.0 27.2 - 53.6 %   Platelets 218 140 - 400 Thousand/uL   MPV 11.8 7.5 - 12.5 fL   Neutro Abs 4,628 1,500 - 7,800 cells/uL   Lymphs Abs 2,965 850 - 3,900 cells/uL   WBC mixed population 697 200 - 950 cells/uL   Eosinophils Absolute 76 15 - 500 cells/uL   Basophils Absolute 34 0 - 200 cells/uL   Neutrophils Relative % 55.1 %   Total Lymphocyte 35.3 %   Monocytes Relative 8.3 %   Eosinophils Relative 0.9 %   Basophils Relative 0.4 %  Comprehensive metabolic panel  Result Value Ref Range   Glucose, Bld 67 65 - 99 mg/dL   BUN 18 7 - 25 mg/dL   Creat 6.44 0.34 - 7.42 mg/dL   BUN/Creatinine Ratio NOT APPLICABLE 6 - 22 (calc)   Sodium 140 135 - 146 mmol/L   Potassium 3.9 3.5 - 5.3 mmol/L   Chloride 105 98 - 110 mmol/L   CO2 23 20 - 32 mmol/L   Calcium 10.2 8.6 - 10.2 mg/dL   Total Protein 7.3 6.1 - 8.1 g/dL   Albumin 4.3 3.6 - 5.1 g/dL   Globulin 3.0 1.9 - 3.7 g/dL (calc)   AG Ratio 1.4 1.0 - 2.5 (calc)   Total Bilirubin 0.8 0.2 - 1.2 mg/dL   Alkaline phosphatase (APISO) 66 33 - 115 U/L   AST 20 10 - 30 U/L   ALT 41 (H) 6 - 29 U/L  Hemoglobin A1c  Result Value Ref Range   Hgb A1c MFr Bld 5.5 <5.7 % of total Hgb   Mean Plasma Glucose 111 (calc)   eAG (mmol/L) 6.2 (calc)  Vitamin B12  Result Value Ref Range   Vitamin B-12 496 200 - 1,100 pg/mL  VITAMIN D 25 Hydroxy (Vit-D Deficiency, Fractures)  Result Value Ref Range   Vit D, 25-Hydroxy 32 30 - 100 ng/mL    Assessment and Plan:   Riven was seen today for insulin resistance.  Diagnoses and all orders for this visit:  Insulin resistance Comments: Improved. Continue Metformin. Orders: -     Hemoglobin A1c  Fatigue, unspecified type  PCOS (polycystic ovarian syndrome) Comments: Continue current treatment.   Encounter for contraceptive  management, Depo Comments: Due for injection today. Orders: -     medroxyPROGESTERone (DEPO-PROVERA) injection 150 mg  Elevated liver function tests Comments: Will order [place].  Orders: -     CBC with Differential/Platelet -     Comprehensive metabolic panel -     US ABDOMEN LIMITED RUQ; Future  Lower extremity edema Comments: Improved.   Vitamin D deficiency Comments: Improved. Orders: -     VITAMIN D 25 Hydroxy (Vit-D Deficiency, Fractures)  B12 deficiency Comments: Improved. Orders: -     Vitamin B12  Seasonal allergies    . Orders and follow up as documented in EpicCare, reviewed diet, exercise and weight control, cardiovascular risk and specific lipid/LDL goals reviewed, reviewed medications and side effects in detail.  . Reviewed expectations re: course of current medical issues. . Outlined signs and symptoms indicating need for more acute intervention. . Patient verbalized understanding and all questions were answered. . Patient received an After Visit Summary.  Helane Rima, DO [place], [facility] 12/11/2018

## 2018-12-10 ENCOUNTER — Encounter: Payer: Self-pay | Admitting: Family Medicine

## 2018-12-10 DIAGNOSIS — E88819 Insulin resistance, unspecified: Secondary | ICD-10-CM | POA: Insufficient documentation

## 2018-12-10 DIAGNOSIS — E8881 Metabolic syndrome: Secondary | ICD-10-CM | POA: Insufficient documentation

## 2018-12-10 DIAGNOSIS — Z309 Encounter for contraceptive management, unspecified: Secondary | ICD-10-CM | POA: Insufficient documentation

## 2018-12-10 DIAGNOSIS — J302 Other seasonal allergic rhinitis: Secondary | ICD-10-CM | POA: Insufficient documentation

## 2018-12-10 DIAGNOSIS — R7989 Other specified abnormal findings of blood chemistry: Secondary | ICD-10-CM | POA: Insufficient documentation

## 2018-12-10 DIAGNOSIS — R600 Localized edema: Secondary | ICD-10-CM | POA: Insufficient documentation

## 2018-12-10 DIAGNOSIS — R945 Abnormal results of liver function studies: Secondary | ICD-10-CM

## 2018-12-10 DIAGNOSIS — E282 Polycystic ovarian syndrome: Secondary | ICD-10-CM | POA: Insufficient documentation

## 2018-12-10 DIAGNOSIS — E559 Vitamin D deficiency, unspecified: Secondary | ICD-10-CM | POA: Insufficient documentation

## 2018-12-10 DIAGNOSIS — E538 Deficiency of other specified B group vitamins: Secondary | ICD-10-CM | POA: Insufficient documentation

## 2018-12-10 LAB — CBC WITH DIFFERENTIAL/PLATELET
Basophils Absolute: 34 cells/uL (ref 0–200)
Basophils Relative: 0.4 %
Eosinophils Absolute: 76 cells/uL (ref 15–500)
Eosinophils Relative: 0.9 %
HCT: 42 % (ref 35.0–45.0)
Hemoglobin: 14.5 g/dL (ref 11.7–15.5)
Lymphs Abs: 2965 cells/uL (ref 850–3900)
MCH: 31.3 pg (ref 27.0–33.0)
MCHC: 34.5 g/dL (ref 32.0–36.0)
MCV: 90.7 fL (ref 80.0–100.0)
MPV: 11.8 fL (ref 7.5–12.5)
Monocytes Relative: 8.3 %
Neutro Abs: 4628 cells/uL (ref 1500–7800)
Neutrophils Relative %: 55.1 %
Platelets: 218 10*3/uL (ref 140–400)
RBC: 4.63 10*6/uL (ref 3.80–5.10)
RDW: 13 % (ref 11.0–15.0)
Total Lymphocyte: 35.3 %
WBC mixed population: 697 cells/uL (ref 200–950)
WBC: 8.4 10*3/uL (ref 3.8–10.8)

## 2018-12-10 LAB — VITAMIN D 25 HYDROXY (VIT D DEFICIENCY, FRACTURES): Vit D, 25-Hydroxy: 32 ng/mL (ref 30–100)

## 2018-12-10 LAB — COMPREHENSIVE METABOLIC PANEL
AG Ratio: 1.4 (calc) (ref 1.0–2.5)
ALT: 41 U/L — ABNORMAL HIGH (ref 6–29)
AST: 20 U/L (ref 10–30)
Albumin: 4.3 g/dL (ref 3.6–5.1)
Alkaline phosphatase (APISO): 66 U/L (ref 33–115)
BUN: 18 mg/dL (ref 7–25)
CO2: 23 mmol/L (ref 20–32)
Calcium: 10.2 mg/dL (ref 8.6–10.2)
Chloride: 105 mmol/L (ref 98–110)
Creat: 0.72 mg/dL (ref 0.50–1.10)
Globulin: 3 g/dL (calc) (ref 1.9–3.7)
Glucose, Bld: 67 mg/dL (ref 65–99)
Potassium: 3.9 mmol/L (ref 3.5–5.3)
Sodium: 140 mmol/L (ref 135–146)
Total Bilirubin: 0.8 mg/dL (ref 0.2–1.2)
Total Protein: 7.3 g/dL (ref 6.1–8.1)

## 2018-12-10 LAB — VITAMIN B12: Vitamin B-12: 496 pg/mL (ref 200–1100)

## 2018-12-10 LAB — HEMOGLOBIN A1C
Hgb A1c MFr Bld: 5.5 % of total Hgb (ref <5.7)
Mean Plasma Glucose: 111 (calc)
eAG (mmol/L): 6.2 (calc)

## 2018-12-12 ENCOUNTER — Encounter: Payer: Self-pay | Admitting: Podiatry

## 2018-12-12 ENCOUNTER — Ambulatory Visit (INDEPENDENT_AMBULATORY_CARE_PROVIDER_SITE_OTHER): Payer: BLUE CROSS/BLUE SHIELD | Admitting: Podiatry

## 2018-12-12 ENCOUNTER — Ambulatory Visit: Payer: BLUE CROSS/BLUE SHIELD | Admitting: Orthotics

## 2018-12-12 DIAGNOSIS — M7670 Peroneal tendinitis, unspecified leg: Secondary | ICD-10-CM

## 2018-12-12 DIAGNOSIS — M722 Plantar fascial fibromatosis: Secondary | ICD-10-CM

## 2018-12-12 DIAGNOSIS — S86302A Unspecified injury of muscle(s) and tendon(s) of peroneal muscle group at lower leg level, left leg, initial encounter: Secondary | ICD-10-CM

## 2018-12-12 MED ORDER — CLOTRIMAZOLE-BETAMETHASONE 1-0.05 % EX CREA: 1.0000 "application " | TOPICAL_CREAM | Freq: Two times a day (BID) | CUTANEOUS | 0 refills | Status: DC

## 2018-12-12 MED ORDER — LANSOPRAZOLE 30 MG PO CPDR: 30.0000 mg | DELAYED_RELEASE_CAPSULE | Freq: Every day | ORAL | 1 refills | Status: AC

## 2018-12-12 MED ORDER — DICLOFENAC SODIUM 1 % TD GEL: 2.0000 g | Freq: Four times a day (QID) | TRANSDERMAL | 2 refills | Status: AC

## 2018-12-12 MED ORDER — FAMOTIDINE 20 MG PO TABS: 20.0000 mg | ORAL_TABLET | Freq: Two times a day (BID) | ORAL | 0 refills | Status: DC

## 2018-12-12 NOTE — Patient Instructions (Signed)
Peroneal Tendinopathy Rehab  Ask your health care provider which exercises are safe for you. Do exercises exactly as told by your health care provider and adjust them as directed. It is normal to feel mild stretching, pulling, tightness, or discomfort as you do these exercises, but you should stop right away if you feel sudden pain or your pain gets worse.Do not begin these exercises until told by your health care provider.  Stretching and range of motion exercises  These exercises warm up your muscles and joints and improve the movement and flexibility of your ankle. These exercises also help to relieve pain and stiffness.  Exercise A: Gastroc and soleus, standing  1. Stand on the edge of a step on the balls of your feet. The ball of your foot is on the walking surface, right under your toes.  2. Hold onto the railing for balance.  3. Slowly lift your left / right foot, allowing your body weight to press your left / right heel down over the edge of the step. You should feel a stretch in your left / right calf.  4. Hold this position for __________ seconds.  Repeat __________ times with your left / right knee straight and __________ times with your left / right knee bent. Complete this stretch __________ times per day.  Strengthening exercises  These exercises improve the strength and endurance of your foot and ankle. Endurance is the ability to use your muscles for a long time, even after they get tired.  Exercise B: Dorsiflexors    1. Secure a rubber exercise band or tube to an object, like a table leg, that will not move if it is pulled on.  2. Secure the other end of the band around your left / right foot.  3. Sit on the floor, facing the object with your left / right foot extended. The band or tube should be slightly tense when your foot is relaxed.  4. Slowly flex your left / right ankle and toes to bring your foot toward you.  5. Hold this position for __________ seconds.  6. Slowly return your foot to the  starting position.  Repeat __________ times. Complete this exercise __________ times per day.  Exercise C: Evertors  1. Sit on the floor with your legs straight out in front of you.  2. Loop a rubber exercise or band or tube around the ball of your left / right foot. The ball of your foot is on the walking surface, right under your toes.  3. Hold the ends of the band in your hands, or secure the band to a stable object.  4. Slowly push your foot outward, away from your other leg.  5. Hold this position for __________ seconds.  6. Slowly return your foot to the starting position.  Repeat __________ times. Complete this exercise __________ times per day.  Exercise D: Standing heel raise (  plantar flexion)  1. Stand with your feet shoulder-width apart with the balls of your feet on a step. The ball of your foot is on the walking surface, right under your toes.  2. Keep your weight spread evenly over the width of your feet while you rise up on your toes. Use a wall or railing to steady yourself, but try not to use it for support.  3. If this exercise is too easy, try these options:  ? Shift your weight toward your left / right leg until you feel challenged.  ? If told by   your health care provider, stand on your left / right leg only.  4. Hold this position for __________ seconds.  Repeat __________ times. Complete this exercise __________ times per day.  Exercise E: Single leg stand  1. Without shoes, stand near a railing or in a doorway. You may hold onto the railing or door frame as needed.  2. Stand on your left / right foot. Keep your big toe down on the floor and try to keep your arch lifted.  ? Do not roll to the outside of your foot.  ? If this exercise is too easy, you can try it with your eyes closed or while standing on a pillow.  3. Hold this position for __________ seconds.  Repeat __________ times. Complete this exercise __________ times per day.  This information is not intended to replace advice given to  you by your health care provider. Make sure you discuss any questions you have with your health care provider.  Document Released: 12/14/2005 Document Revised: 08/20/2016 Document Reviewed: 11/02/2015  Elsevier Interactive Patient Education  2018 [employer]

## 2018-12-12 NOTE — Progress Notes (Signed)
Patient came in today to pick up custom made foot orthotics.  The goals were accomplished and the patient reported no dissatisfaction with said orthotics.  Patient was advised of breakin period and how to report any issues. 

## 2018-12-14 NOTE — Progress Notes (Signed)
Subjective: 37 year old female presents the office today for follow-up evaluation of left peroneal tendinitis.  She says overall is getting better.  Left side is still worse than the right.  She did get the inserts today but she did not try the monitor she does not bring her shoes to wear them and.  The brace is also been helping.  She denies any recent injury or trauma or any changes otherwise.  She has no new concerns.  Overall her heels doing much better.  Only time she has heel pain is when she stands in the shower. Denies any systemic complaints such as fevers, chills, nausea, vomiting. No acute changes since last appointment, and no other complaints at this time.   Objective: AAO x3, NAD DP/PT pulses palpable bilaterally, CRT less than 3 seconds There is tenderness, although improved on the course the peroneal tendon mostly posterior to the lateral malleolus, fibula.  Peroneal tendon, flexor, extensor tendons appear to be intact.  Achilles tendon intact without any discomfort.  No edema, erythema. No open lesions or pre-ulcerative lesions.  No pain with calf compression, swelling, warmth, erythema  Assessment: Peroneal tendinitis  Plan: -All treatment options discussed with the patient including all alternatives, risks, complications.  -Rick dispensed orthotics today.  Written and oral break-in instructions were discussed. -I prescribed Voltaren gel to apply to the peroneal tendon she can massage into the area.  Over the orthotics will also be beneficial for her. -Start stretching, rehab exercises as well which we discussed today. -Patient encouraged to call the office with any questions, concerns, change in symptoms.   Vivi BarrackMatthew R Nester Bachus DPM

## 2018-12-22 ENCOUNTER — Encounter: Payer: Self-pay | Admitting: Family Medicine

## 2018-12-23 DIAGNOSIS — H40013 Open angle with borderline findings, low risk, bilateral: Secondary | ICD-10-CM | POA: Diagnosis not present

## 2019-01-20 ENCOUNTER — Ambulatory Visit
Admission: RE | Admit: 2019-01-20 | Discharge: 2019-01-20 | Disposition: A | Discharge status: Home / Self Care | Payer: BLUE CROSS/BLUE SHIELD | Source: Ambulatory Visit | Attending: Family Medicine | Admitting: Family Medicine

## 2019-01-20 DIAGNOSIS — R7989 Other specified abnormal findings of blood chemistry: Secondary | ICD-10-CM | POA: Diagnosis not present

## 2019-01-20 DIAGNOSIS — R945 Abnormal results of liver function studies: Principal | ICD-10-CM

## 2019-01-29 ENCOUNTER — Other Ambulatory Visit: Payer: Self-pay | Admitting: Family Medicine

## 2019-01-31 ENCOUNTER — Encounter: Payer: Self-pay | Admitting: Physician Assistant

## 2019-01-31 ENCOUNTER — Ambulatory Visit (INDEPENDENT_AMBULATORY_CARE_PROVIDER_SITE_OTHER): Payer: BLUE CROSS/BLUE SHIELD | Admitting: Physician Assistant

## 2019-01-31 VITALS — BP 110/76 | HR 100 | Temp 98.6°F | Ht 62.0 in | Wt 256.5 lb

## 2019-01-31 DIAGNOSIS — R6889 Other general symptoms and signs: Secondary | ICD-10-CM

## 2019-01-31 LAB — POC INFLUENZA A&B (BINAX/QUICKVUE)
Influenza A, POC: NEGATIVE
Influenza B, POC: NEGATIVE

## 2019-01-31 MED ORDER — ALBUTEROL SULFATE HFA 108 (90 BASE) MCG/ACT IN AERS: 1.0000 | INHALATION_SPRAY | Freq: Four times a day (QID) | RESPIRATORY_TRACT | 0 refills | Status: DC | PRN

## 2019-01-31 MED ORDER — PREDNISONE 20 MG PO TABS: 40.0000 mg | ORAL_TABLET | Freq: Every day | ORAL | 0 refills | Status: DC

## 2019-01-31 NOTE — Patient Instructions (Signed)
It was great to see you!  Flu test negative :)  You have a viral upper respiratory infection. Antibiotics are not needed for this.  Viral infections usually take 7-10 days to resolve.  The cough can last a few weeks to go away.  Use medication as prescribed: oral prednisone  Push fluids and get plenty of rest. Please return if you are not improving as expected, or if you have high fevers (>101.5) or difficulty swallowing or worsening productive cough.  Call clinic with questions.  I hope you start feeling better soon!

## 2019-01-31 NOTE — Progress Notes (Signed)
Denise Keller is a 38 y.o. female here for a new problem.  I acted as a Neurosurgeon for [employer], PA-C Corky Mull, LPN  History of Present Illness:   Chief Complaint  Patient presents with  . Cough    Cough  This is a new problem. Episode onset: Started on Sunday. The problem has been gradually worsening. The problem occurs every few hours. The cough is non-productive. Associated symptoms include chills, ear pain, headaches, postnasal drip, a sore throat and shortness of breath. Pertinent negatives include no fever. The symptoms are aggravated by lying down. She has tried OTC cough suppressant (Robitussin, Albuterol) for the symptoms. The treatment provided no relief. Her past medical history is significant for asthma and bronchitis. There is no history of pneumonia.   She states that she cannot afford nor tolerate steroid-containing inhalers due to increased heart rate.  Past Medical History:  Diagnosis Date  . Asthma   . GERD (gastroesophageal reflux disease)   . Hyperlipidemia   . Seasonal allergies   . Thyroid disease      Social History   Socioeconomic History  . Marital status: Single    Spouse name: Not on file  . Number of children: Not on file  . Years of education: Not on file  . Highest education level: Not on file  Occupational History  . Not on file  Social Needs  . Financial resource strain: Not on file  . Food insecurity:    Worry: Not on file    Inability: Not on file  . Transportation needs:    Medical: Not on file    Non-medical: Not on file  Tobacco Use  . Smoking status: Never Smoker  . Smokeless tobacco: Never Used  Substance and Sexual Activity  . Alcohol use: Not on file  . Drug use: Not on file  . Sexual activity: Not on file  Lifestyle  . Physical activity:    Days per week: Not on file    Minutes per session: Not on file  . Stress: Not on file  Relationships  . Social connections:    Talks on phone: Not on file    Gets  together: Not on file    Attends religious service: Not on file    Active member of club or organization: Not on file    Attends meetings of clubs or organizations: Not on file    Relationship status: Not on file  . Intimate partner violence:    Fear of current or ex partner: Not on file    Emotionally abused: Not on file    Physically abused: Not on file    Forced sexual activity: Not on file  Other Topics Concern  . Not on file  Social History Narrative  . Not on file    Past Surgical History:  Procedure Laterality Date  . MASTOIDECTOMY Left   . TONSILLECTOMY  2000    Family History  Problem Relation Age of Onset  . Arthritis Mother   . Hyperlipidemia Mother   . Hypertension Mother   . Learning disabilities Mother   . Alcohol abuse Father   . COPD Father   . Heart disease Father   . Hyperlipidemia Father   . Hypertension Father   . Arthritis Sister   . Miscarriages / Stillbirths Sister   . Arthritis Maternal Grandmother   . Cancer Maternal Grandmother   . Diabetes Maternal Grandmother   . Learning disabilities Maternal Grandmother   . Stroke Maternal Grandmother   .  Early death Maternal Grandfather   . Heart disease Maternal Grandfather     Allergies  Allergen Reactions  . Biaxin [Clarithromycin]   . Eggs Or Egg-Derived Products     Current Medications:   Current Outpatient Medications:  .  albuterol (PROVENTIL HFA;VENTOLIN HFA) 108 (90 Base) MCG/ACT inhaler, Inhale 1 puff into the lungs every 6 (six) hours as needed for wheezing or shortness of breath., Disp: 1 Inhaler, Rfl: 0 .  Ascorbic Acid (VITAMIN C) 1000 MG tablet, Take 1,000 mg by mouth daily., Disp: , Rfl:  .  azelastine (ASTELIN) 0.1 % nasal spray, Place 2 sprays into both nostrils 2 (two) times daily., Disp: 30 mL, Rfl: 2 .  b complex vitamins tablet, Take 1 tablet by mouth daily., Disp: , Rfl:  .  Cholecalciferol (VITAMIN D3) 1000 units CAPS, Take by mouth., Disp: , Rfl:  .   clotrimazole-betamethasone (LOTRISONE) cream, Apply 1 application topically 2 (two) times daily., Disp: 30 g, Rfl: 0 .  diclofenac sodium (VOLTAREN) 1 % GEL, Apply 2 g topically 4 (four) times daily. Rub into affected area of foot 2 to 4 times daily, Disp: 100 g, Rfl: 2 .  famotidine (PEPCID) 20 MG tablet, TAKE 1 TABLET BY MOUTH TWICE DAILY, Disp: 90 tablet, Rfl: 0 .  Ferrous Gluconate-C-Folic Acid (IRON-C PO), Take 65 mg by mouth., Disp: , Rfl:  .  Flaxseed, Linseed, (FLAXSEED OIL PO), Take by mouth., Disp: , Rfl:  .  fluticasone (FLONASE) 50 MCG/ACT nasal spray, Place into both nostrils daily., Disp: , Rfl:  .  lansoprazole (PREVACID) 30 MG capsule, Take 1 capsule (30 mg total) by mouth daily at 12 noon., Disp: 90 capsule, Rfl: 1 .  loratadine (CLARITIN) 10 MG tablet, Take 10 mg by mouth daily., Disp: , Rfl:  .  medroxyPROGESTERone Acetate 150 MG/ML SUSY, , Disp: , Rfl:  .  metFORMIN (GLUCOPHAGE-XR) 500 MG 24 hr tablet, Take 1 tablet (500 mg total) by mouth at bedtime., Disp: 90 tablet, Rfl: 3 .  Multiple Vitamin (MULTIVITAMIN) tablet, Take 1 tablet by mouth daily., Disp: , Rfl:  .  spironolactone (ALDACTONE) 100 MG tablet, TAKE 1 TABLET BY MOUTH ONCE DAILY, Disp: 30 tablet, Rfl: 2 .  Turmeric 500 MG CAPS, Take by mouth., Disp: , Rfl:  .  predniSONE (DELTASONE) 20 MG tablet, Take 2 tablets (40 mg total) by mouth daily., Disp: 10 tablet, Rfl: 0   Review of Systems:   Review of Systems  Constitutional: Positive for chills. Negative for fever.  HENT: Positive for ear pain, postnasal drip and sore throat.   Respiratory: Positive for cough and shortness of breath.   Neurological: Positive for headaches.    Vitals:   Vitals:   01/31/19 1049  BP: 110/76  Pulse: 100  Temp: 98.6 F (37 C)  TempSrc: Oral  SpO2: 97%  Weight: 256 lb 8 oz (116.3 kg)  Height: 5\' 2"  (1.575 m)     Body mass index is 46.91 kg/m.  Physical Exam:   Physical Exam Vitals signs and nursing note reviewed.   Constitutional:      General: She is not in acute distress.    Appearance: She is well-developed. She is not ill-appearing or toxic-appearing.  HENT:     Head: Normocephalic and atraumatic.     Right Ear: Tympanic membrane, ear canal and external ear normal. Tympanic membrane is not erythematous, retracted or bulging.     Left Ear: Ear canal and external ear normal.     Ears:  Comments: L TM with significant scarring    Nose: Mucosal edema, congestion and rhinorrhea present.     Right Sinus: No maxillary sinus tenderness or frontal sinus tenderness.     Left Sinus: No maxillary sinus tenderness or frontal sinus tenderness.     Mouth/Throat:     Lips: Pink.     Mouth: Mucous membranes are moist.     Pharynx: Uvula midline. Posterior oropharyngeal erythema present.     Tonsils: Swelling: 0 on the right. 0 on the left.  Eyes:     General: Lids are normal.     Conjunctiva/sclera: Conjunctivae normal.  Neck:     Trachea: Trachea normal.  Cardiovascular:     Rate and Rhythm: Normal rate and regular rhythm.     Heart sounds: Normal heart sounds, S1 normal and S2 normal.  Pulmonary:     Effort: Pulmonary effort is normal.     Breath sounds: Examination of the right-lower field reveals decreased breath sounds. Examination of the left-lower field reveals decreased breath sounds. Decreased breath sounds present. No wheezing, rhonchi or rales.  Lymphadenopathy:     Cervical: No cervical adenopathy.  Skin:    General: Skin is warm and dry.  Neurological:     Mental Status: She is alert.  Psychiatric:        Speech: Speech normal.        Behavior: Behavior normal. Behavior is cooperative.     Results for orders placed or performed in visit on 01/31/19  POC Influenza A&B(BINAX/QUICKVUE)  Result Value Ref Range   Influenza A, POC Negative Negative   Influenza B, POC Negative Negative      Assessment and Plan:   Sammy was seen today for cough.  Diagnoses and all orders for  this visit:  Flu-like symptoms No red flags on exam. Flu test was negative. Suspect viral URI. Does have history as asthma and bronchitis. No wheezing heard on exam, but will trial oral prednisone -- she states that she can tolerate this well per her history. Discussed taking medications as prescribed. Reviewed return precautions including worsening fever, SOB, worsening cough or other concerns. Push fluids and rest. I recommend that patient follow-up if symptoms worsen or persist despite treatment x 7-10 days, sooner if needed. -     POC Influenza A&B(BINAX/QUICKVUE)  Other orders -     predniSONE (DELTASONE) 20 MG tablet; Take 2 tablets (40 mg total) by mouth daily. -     albuterol (PROVENTIL HFA;VENTOLIN HFA) 108 (90 Base) MCG/ACT inhaler; Inhale 1 puff into the lungs every 6 (six) hours as needed for wheezing or shortness of breath.  . Reviewed expectations re: course of current medical issues. . Discussed self-management of symptoms. . Outlined signs and symptoms indicating need for more acute intervention. . Patient verbalized understanding and all questions were answered. . See orders for this visit as documented in the electronic medical record. . Patient received an After-Visit Summary.  CMA or LPN served as scribe during this visit. History, Physical, and Plan performed by medical provider. The above documentation has been reviewed and is accurate and complete.  Jarold Motto, PA-C

## 2019-03-02 NOTE — Progress Notes (Signed)
Denise Keller is a 38 y.o. female is here for follow up.  History of Present Illness:   Denise Keller, CMA acting as scribe for Dr. Helane RimaErica Wallace.   HPI: Patient in office for pap today. She also is due for her Depo injections. She has skin tags on neck she would like to have evaluated. Interested in weight loss management.  Patient with history of asthma.  Diagnosed with viral upper respiratory illness a few weeks ago.  Since then, asthma has been worse.  She is using albuterol sparingly because of tachycardia associated with it.  She has done well with Pulmicort in the past.  She denies chest pain, shortness of breath, edema.  Health Maintenance Due  Topic Date Due  . PAP SMEAR-Modifier  08/10/2015   Depression screen PHQ 2/9 08/10/2018  Decreased Interest 0  Down, Depressed, Hopeless 0  PHQ - 2 Score 0   PMHx, SurgHx, SocialHx, FamHx, Medications, and Allergies were reviewed in the Visit Navigator and updated as appropriate.   Patient Active Problem List   Diagnosis Date Noted  . Insulin resistance 12/10/2018  . PCOS (polycystic ovarian syndrome) 12/10/2018  . Encounter for contraceptive management 12/10/2018  . Elevated liver function tests 12/10/2018  . Lower extremity edema 12/10/2018  . Vitamin D deficiency 12/10/2018  . B12 deficiency 12/10/2018  . Seasonal allergies 12/10/2018  . Plantar fasciitis 05/02/2018   Social History   Tobacco Use  . Smoking status: Never Smoker  . Smokeless tobacco: Never Used  Substance Use Topics  . Alcohol use: Not on file  . Drug use: Not on file   Current Medications and Allergies   .  albuterol (PROVENTIL HFA;VENTOLIN HFA) 108 (90 Base) MCG/ACT inhaler, Inhale 1 puff into the lungs every 6 (six) hours as needed for wheezing or shortness of breath., Disp: 1 Inhaler, Rfl: 0 .  Ascorbic Acid (VITAMIN C) 1000 MG tablet, Take 1,000 mg by mouth daily., Disp: , Rfl:  .  azelastine (ASTELIN) 0.1 % nasal spray, Place 2 sprays into  both nostrils 2 (two) times daily., Disp: 30 mL, Rfl: 2 .  b complex vitamins tablet, Take 1 tablet by mouth daily., Disp: , Rfl:  .  Cholecalciferol (VITAMIN D3) 1000 units CAPS, Take by mouth., Disp: , Rfl:  .  clotrimazole-betamethasone (LOTRISONE) cream, Apply 1 application topically 2 (two) times daily., Disp: 30 g, Rfl: 0 .  diclofenac sodium (VOLTAREN) 1 % GEL, Apply 2 g topically 4 (four) times daily. Rub into affected area of foot 2 to 4 times daily, Disp: 100 g, Rfl: 2 .  famotidine (PEPCID) 20 MG tablet, TAKE 1 TABLET BY MOUTH TWICE DAILY, Disp: 90 tablet, Rfl: 0 .  Ferrous Gluconate-C-Folic Acid (IRON-C PO), Take 65 mg by mouth., Disp: , Rfl:  .  Flaxseed, Linseed, (FLAXSEED OIL PO), Take by mouth., Disp: , Rfl:  .  fluticasone (FLONASE) 50 MCG/ACT nasal spray, Place into both nostrils daily., Disp: , Rfl:  .  lansoprazole (PREVACID) 30 MG capsule, Take 1 capsule (30 mg total) by mouth daily at 12 noon., Disp: 90 capsule, Rfl: 1 .  loratadine (CLARITIN) 10 MG tablet, Take 10 mg by mouth daily., Disp: , Rfl:  .  medroxyPROGESTERone Acetate 150 MG/ML SUSY, , Disp: , Rfl:  .  metFORMIN (GLUCOPHAGE-XR) 500 MG 24 hr tablet, Take 1 tablet (500 mg total) by mouth at bedtime., Disp: 90 tablet, Rfl: 3 .  Multiple Vitamin (MULTIVITAMIN) tablet, Take 1 tablet by mouth daily., Disp: , Rfl:  .  spironolactone (ALDACTONE) 100 MG tablet, TAKE 1 TABLET BY MOUTH ONCE DAILY, Disp: 30 tablet, Rfl: 2 .  Turmeric 500 MG CAPS, Take by mouth., Disp: , Rfl:     Allergies  Allergen Reactions  . Biaxin [Clarithromycin]   . Eggs Or Egg-Derived Products    Review of Systems   Pertinent items are noted in the HPI. Otherwise, a complete ROS is negative.  Vitals   Vitals:   03/03/19 0848  BP: 108/74  Pulse: 100  Temp: 98.1 F (36.7 C)  TempSrc: Oral  SpO2: 96%  Weight: 258 lb 9.6 oz (117.3 kg)  Height: 5\' 2"  (1.575 m)     Body mass index is 47.3 kg/m.  Physical Exam   Physical Exam Vitals  signs and nursing note reviewed. Exam conducted with a chaperone present.  Constitutional:      General: She is not in acute distress.    Appearance: She is well-developed.  HENT:     Head: Normocephalic and atraumatic.     Right Ear: External ear normal.     Left Ear: External ear normal.     Nose: Nose normal.  Eyes:     Conjunctiva/sclera: Conjunctivae normal.     Pupils: Pupils are equal, round, and reactive to light.  Neck:     Musculoskeletal: Normal range of motion and neck supple.     Thyroid: No thyromegaly.     Comments: Right salivary gland swelling and tenderness. Cardiovascular:     Rate and Rhythm: Normal rate and regular rhythm.     Heart sounds: Normal heart sounds.  Pulmonary:     Effort: Pulmonary effort is normal.     Breath sounds: Normal breath sounds.  Abdominal:     General: Bowel sounds are normal.     Palpations: Abdomen is soft.     Hernia: There is no hernia in the right inguinal area or left inguinal area.  Genitourinary:    General: Normal vulva.     Pubic Area: No rash.      Labia:        Right: No rash or lesion.        Left: No rash or lesion.      Vagina: Normal.     Cervix: Normal.     Uterus: Normal.      Adnexa: Right adnexa normal and left adnexa normal.  Musculoskeletal: Normal range of motion.  Lymphadenopathy:     Cervical: No cervical adenopathy.     Lower Body: No right inguinal adenopathy. No left inguinal adenopathy.  Skin:    General: Skin is warm and dry.     Capillary Refill: Capillary refill takes less than 2 seconds.     Comments: Patient has multiple skin tags along her neck, both anterior to posterior as well as bilateral axilla.  She has a irritated, brown to black-colored lesion, likely consistent with a seborrheic keratosis, diameter of 2 cm.  Neurological:     Mental Status: She is alert and oriented to person, place, and time.  Psychiatric:        Behavior: Behavior normal.    Assessment and Plan   [name] was  seen today for follow-up.  Diagnoses and all orders for this visit:  Mild persistent asthma with exacerbation -     Budesonide (PULMICORT FLEXHALER) 90 MCG/ACT inhaler; Inhale 1 puff into the lungs 2 (two) times daily.  Screening for cervical cancer -     Cytology - PAP( [facility])  Encounter for  contraceptive management, Depo -     medroxyPROGESTERone (DEPO-PROVERA) injection 150 mg  Skin tags, multiple acquired Skin Tag Removal:  Verbal consent obtained. The patient was prepped with alcohol at all areas of excision. Sterile plain pickups and sterile iris scissors were used to excise 15 skin tags without difficulty. Minimal bleeding. Patient tolerated the procedure well.  PCOS (polycystic ovarian syndrome)  Insulin resistance  Salivary gland enlargement -     US Soft Tissue Head/Neck  Skin lesion -     Dermatology pathology([place])   . Orders and follow up as documented in EpicCare, reviewed diet, exercise and weight control, cardiovascular risk and specific lipid/LDL goals reviewed, reviewed medications and side effects in detail.  . Reviewed expectations re: course of current medical issues. . Outlined signs and symptoms indicating need for more acute intervention. . Patient verbalized understanding and all questions were answered. . Patient received an After Visit Summary.  CMA served as Neurosurgeon during this visit. History, Physical, and Plan performed by medical provider. The above documentation has been reviewed and is accurate and complete. Helane Rima, D.O.  Helane Rima, DO [place], [facility] 03/03/2019

## 2019-03-03 ENCOUNTER — Other Ambulatory Visit (HOSPITAL_COMMUNITY)
Admission: RE | Admit: 2019-03-03 | Discharge: 2019-03-03 | Disposition: A | Discharge status: Home / Self Care | Payer: BLUE CROSS/BLUE SHIELD | Source: Ambulatory Visit | Attending: Family Medicine | Admitting: Family Medicine

## 2019-03-03 ENCOUNTER — Ambulatory Visit (INDEPENDENT_AMBULATORY_CARE_PROVIDER_SITE_OTHER): Payer: BLUE CROSS/BLUE SHIELD | Admitting: Family Medicine

## 2019-03-03 ENCOUNTER — Encounter: Payer: Self-pay | Admitting: Family Medicine

## 2019-03-03 VITALS — BP 108/74 | HR 100 | Temp 98.1°F | Ht 62.0 in | Wt 258.6 lb

## 2019-03-03 DIAGNOSIS — K111 Hypertrophy of salivary gland: Secondary | ICD-10-CM

## 2019-03-03 DIAGNOSIS — L918 Other hypertrophic disorders of the skin: Secondary | ICD-10-CM | POA: Diagnosis not present

## 2019-03-03 DIAGNOSIS — E88819 Insulin resistance, unspecified: Secondary | ICD-10-CM

## 2019-03-03 DIAGNOSIS — L989 Disorder of the skin and subcutaneous tissue, unspecified: Secondary | ICD-10-CM

## 2019-03-03 DIAGNOSIS — J4531 Mild persistent asthma with (acute) exacerbation: Secondary | ICD-10-CM

## 2019-03-03 DIAGNOSIS — Z124 Encounter for screening for malignant neoplasm of cervix: Secondary | ICD-10-CM

## 2019-03-03 DIAGNOSIS — D225 Melanocytic nevi of trunk: Secondary | ICD-10-CM | POA: Diagnosis not present

## 2019-03-03 DIAGNOSIS — Z309 Encounter for contraceptive management, unspecified: Secondary | ICD-10-CM | POA: Diagnosis not present

## 2019-03-03 DIAGNOSIS — E8881 Metabolic syndrome: Secondary | ICD-10-CM

## 2019-03-03 DIAGNOSIS — E282 Polycystic ovarian syndrome: Secondary | ICD-10-CM

## 2019-03-03 MED ORDER — BUDESONIDE 90 MCG/ACT IN AEPB: 1.0000 | INHALATION_SPRAY | Freq: Two times a day (BID) | RESPIRATORY_TRACT | 2 refills | Status: AC

## 2019-03-03 MED ORDER — MEDROXYPROGESTERONE ACETATE 150 MG/ML IM SUSP
150.0000 mg | Freq: Once | INTRAMUSCULAR | Status: AC
  Administered 2019-03-03: 150 mg via INTRAMUSCULAR

## 2019-03-04 ENCOUNTER — Other Ambulatory Visit: Payer: Self-pay | Admitting: Family Medicine

## 2019-03-05 ENCOUNTER — Encounter: Payer: Self-pay | Admitting: Family Medicine

## 2019-03-06 NOTE — Telephone Encounter (Signed)
Added to open message.  

## 2019-03-07 ENCOUNTER — Other Ambulatory Visit: Payer: Self-pay

## 2019-03-07 LAB — CYTOLOGY - PAP
Diagnosis: NEGATIVE
HPV: NOT DETECTED

## 2019-03-07 MED ORDER — BUDESONIDE 90 MCG/ACT IN AEPB: 1.0000 | INHALATION_SPRAY | Freq: Two times a day (BID) | RESPIRATORY_TRACT | 0 refills | Status: DC

## 2019-03-07 NOTE — Progress Notes (Signed)
.  pul

## 2019-03-24 ENCOUNTER — Other Ambulatory Visit: Payer: Self-pay | Admitting: Family Medicine

## 2019-03-27 ENCOUNTER — Encounter: Payer: Self-pay | Admitting: Family Medicine

## 2019-03-28 MED ORDER — FAMOTIDINE 20 MG PO TABS: 20.0000 mg | ORAL_TABLET | Freq: Two times a day (BID) | ORAL | 3 refills | Status: AC

## 2019-04-13 ENCOUNTER — Ambulatory Visit: Payer: Self-pay | Admitting: *Deleted

## 2019-04-13 NOTE — Telephone Encounter (Signed)
See note

## 2019-04-13 NOTE — Telephone Encounter (Signed)
Pt reports diarrhea, onset Monday night. States continued into Tuesday, resolved by Tuesday night . Had only liquids Wednesday and no diarrhea. States this AM ate bagel  and crackers and diarrhea returned; 5 xs within 24 hrs. States  stool now watery, had some form other days.  States afebrile, no abdominal pain 2/10 "stomach ache" Denies body aches. States is staying hydrated. No hematochezia,, no antibiotics. States "Some stomach cramping at times." TN attempted to reach practice; unable to do so. Email and phone # verified. Directed to UC/ ED if symptoms worsen, fever, abdominal pain. Pt verifies understanding.  Please advise . CB [phone]  Reason for Disposition . [1] MODERATE diarrhea (e.g., 4-6 times / day more than normal) AND [2] present > 48 hours (2 days)  Answer Assessment - Initial Assessment Questions 1. DIARRHEA SEVERITY: "How bad is the diarrhea?" "How many extra stools have you had in the past 24 hours than normal?"    - NO DIARRHEA (SCALE 0)   - MILD (SCALE 1-3): Few loose or mushy BMs; increase of 1-3 stools over normal daily number of stools; mild increase in ostomy output.   -  MODERATE (SCALE 4-7): Increase of 4-6 stools daily over normal; moderate increase in ostomy output. * SEVERE (SCALE 8-10; OR 'WORST POSSIBLE'): Increase of 7 or more stools daily over normal; moderate increase in ostomy output; incontinence.     Moderate 2. ONSET: "When did the diarrhea begin?"      *Monday but none wednesday 3. BM CONSISTENCY: "How loose or watery is the diarrhea?"      Watery today 4. VOMITING: "Are you also vomiting?" If so, ask: "How many times in the past 24 hours?"      no but nausea 5. ABDOMINAL PAIN: "Are you having any abdominal pain?" If yes: "What does it feel like?" (e.g., crampy, dull, intermittent, constant)      Pain Monday night. Now stomach ache 6. ABDOMINAL PAIN SEVERITY: If present, ask: "How bad is the pain?"  (e.g., Scale 1-10; mild, moderate, or severe)   -  MILD (1-3): doesn't interfere with normal activities, abdomen soft and not tender to touch    - MODERATE (4-7): interferes with normal activities or awakens from sleep, tender to touch    - SEVERE (8-10): excruciating pain, doubled over, unable to do any normal activities       2/10 7. ORAL INTAKE: If vomiting, "Have you been able to drink liquids?" "How much fluids have you had in the past 24 hours?"     N/A 8. HYDRATION: "Any signs of dehydration?" (e.g., dry mouth [not just dry lips], too weak to stand, dizziness, new weight loss) "When did you last urinate?"     No 9. EXPOSURE: "Have you traveled to a foreign country recently?" "Have you been exposed to anyone with diarrhea?" "Could you have eaten any food that was spoiled?"     *No Answer* 10. ANTIBIOTIC USE: "Are you taking antibiotics now or have you taken antibiotics in the past 2 months?"       no 11. OTHER SYMPTOMS: "Do you have any other symptoms?" (e.g., fever, blood in stool)       None, no fever 12. PREGNANCY: "Is there any chance you are pregnant?" "When was your last menstrual period?"      no  Protocols used: DIARRHEA-A-AH

## 2019-04-14 ENCOUNTER — Encounter: Payer: Self-pay | Admitting: Physician Assistant

## 2019-04-14 ENCOUNTER — Ambulatory Visit (INDEPENDENT_AMBULATORY_CARE_PROVIDER_SITE_OTHER): Payer: BLUE CROSS/BLUE SHIELD | Admitting: Physician Assistant

## 2019-04-14 DIAGNOSIS — R197 Diarrhea, unspecified: Secondary | ICD-10-CM | POA: Diagnosis not present

## 2019-04-14 MED ORDER — METRONIDAZOLE 500 MG PO TABS: 500.0000 mg | ORAL_TABLET | Freq: Three times a day (TID) | ORAL | 0 refills | Status: AC

## 2019-04-14 MED ORDER — ONDANSETRON 4 MG PO TBDP: 4.0000 mg | ORAL_TABLET | Freq: Three times a day (TID) | ORAL | 0 refills | Status: AC | PRN

## 2019-04-14 MED ORDER — CIPROFLOXACIN HCL 500 MG PO TABS: 500.0000 mg | ORAL_TABLET | Freq: Two times a day (BID) | ORAL | 0 refills | Status: AC

## 2019-04-14 NOTE — Patient Instructions (Signed)
Please increase your fluid intake. Hydration is key!  1. Start antibiotics 2. Use zofran as needed for nausea 3. Hold Metformin until talking to Dr. Earlene Plater Monday at 8:20  Get plenty of rest.   If you feel up to eating solid foods, try to follow the dietary guide at the bottom of your After Visit Summary. These foods are gentler on the stomach.  If you are unable to keep fluids down by mouth, or if symptoms acutely worsen, please go to the ER as you may need IV fluids.    Food Choices to Help Relieve Diarrhea, Adult When you have diarrhea, the foods you eat and your eating habits are very important. Choosing the right foods and drinks can help relieve diarrhea. Also, because diarrhea can last up to 7 days, you need to replace lost fluids and electrolytes (such as sodium, potassium, and chloride) in order to help prevent dehydration.  WHAT GENERAL GUIDELINES DO I NEED TO FOLLOW?  Slowly drink 1 cup (8 oz) of fluid for each episode of diarrhea. If you are getting enough fluid, your urine will be clear or pale yellow.  Eat starchy foods. Some good choices include white rice, white toast, pasta, low-fiber cereal, baked potatoes (without the skin), saltine crackers, and bagels.  Avoid large servings of any cooked vegetables.  Limit fruit to two servings per day. A serving is  cup or 1 small piece.  Choose foods with less than 2 g of fiber per serving.  Limit fats to less than 8 tsp (38 g) per day.  Avoid fried foods.  Eat foods that have probiotics in them. Probiotics can be found in certain dairy products.  Avoid foods and beverages that may increase the speed at which food moves through the stomach and intestines (gastrointestinal tract). Things to avoid include:  High-fiber foods, such as dried fruit, raw fruits and vegetables, nuts, seeds, and whole grain foods.  Spicy foods and high-fat foods.  Foods and beverages sweetened with high-fructose corn syrup, honey, or sugar  alcohols such as xylitol, sorbitol, and mannitol. WHAT FOODS ARE RECOMMENDED? Grains White rice. White, [place], or pita breads (fresh or toasted), including plain rolls, buns, or bagels. White pasta. Saltine, soda, or graham crackers. Pretzels. Low-fiber cereal. Cooked cereals made with water (such as cornmeal, farina, or cream cereals). Plain muffins. Matzo. Melba toast. Zwieback.  Vegetables Potatoes (without the skin). Strained tomato and vegetable juices. Most well-cooked and canned vegetables without seeds. Tender lettuce. Fruits Cooked or canned applesauce, apricots, cherries, fruit cocktail, grapefruit, peaches, pears, or plums. Fresh bananas, apples without skin, cherries, grapes, cantaloupe, grapefruit, peaches, oranges, or plums.  Meat and Other Protein Products Baked or boiled chicken. Eggs. Tofu. Fish. Seafood. Smooth peanut butter. Ground or well-cooked tender beef, ham, veal, lamb, pork, or poultry.  Dairy Plain yogurt, kefir, and unsweetened liquid yogurt. Lactose-free milk, buttermilk, or soy milk. Plain hard cheese. Beverages Sport drinks. Clear broths. Diluted fruit juices (except prune). Regular, caffeine-free sodas such as ginger ale. Water. Decaffeinated teas. Oral rehydration solutions. Sugar-free beverages not sweetened with sugar alcohols. Other Bouillon, broth, or soups made from recommended foods.  The items listed above may not be a complete list of recommended foods or beverages. Contact your dietitian for more options. WHAT FOODS ARE NOT RECOMMENDED? Grains Whole grain, whole wheat, bran, or rye breads, rolls, pastas, crackers, and cereals. Wild or brown rice. Cereals that contain more than 2 g of fiber per serving. Corn tortillas or taco shells. Cooked or dry  oatmeal. Granola. Popcorn. Vegetables Raw vegetables. Cabbage, broccoli, Brussels sprouts, artichokes, baked beans, beet greens, corn, kale, legumes, peas, sweet potatoes, and yams. Potato skins. Cooked spinach  and cabbage. Fruits Dried fruit, including raisins and dates. Raw fruits. Stewed or dried prunes. Fresh apples with skin, apricots, mangoes, pears, raspberries, and strawberries.  Meat and Other Protein Products Chunky peanut butter. Nuts and seeds. Beans and lentils. Tomasa BlaseBacon.  Dairy High-fat cheeses. Milk, chocolate milk, and beverages made with milk, such as milk shakes. Cream. Ice cream. Sweets and Desserts Sweet rolls, doughnuts, and sweet breads. Pancakes and waffles. Fats and Oils Butter. Cream sauces. Margarine. Salad oils. Plain salad dressings. Olives. Avocados.  Beverages Caffeinated beverages (such as coffee, tea, soda, or energy drinks). Alcoholic beverages. Fruit juices with pulp. Prune juice. Soft drinks sweetened with high-fructose corn syrup or sugar alcohols. Other Coconut. Hot sauce. Chili powder. Mayonnaise. Gravy. Cream-based or milk-based soups.  The items listed above may not be a complete list of foods and beverages to avoid. Contact your dietitian for more information. WHAT SHOULD I DO IF I BECOME DEHYDRATED? Diarrhea can sometimes lead to dehydration. Signs of dehydration include dark urine and dry mouth and skin. If you think you are dehydrated, you should rehydrate with an oral rehydration solution. These solutions can be purchased at pharmacies, retail stores, or online.  Drink -1 cup (120-240 mL) of oral rehydration solution each time you have an episode of diarrhea. If drinking this amount makes your diarrhea worse, try drinking smaller amounts more often. For example, drink 1-3 tsp (5-15 mL) every 5-10 minutes.  A general rule for staying hydrated is to drink 1-2 L of fluid per day. Talk to your health care provider about the specific amount you should be drinking each day. Drink enough fluids to keep your urine clear or pale yellow.   This information is not intended to replace advice given to you by your health care provider. Make sure you discuss any questions  you have with your health care provider.   Document Released: 03/05/2004 Document Revised: 01/04/2015 Document Reviewed: 11/06/2013 Elsevier Interactive Patient Education [employer]2016 Elsevier Inc.

## 2019-04-14 NOTE — Telephone Encounter (Signed)
Pt has been scheduled to see Jarold Motto today at 12:00.

## 2019-04-14 NOTE — Progress Notes (Signed)
Virtual Visit via Video   I connected with Denise Keller on 04/14/19 at 12:00 PM EDT by a video enabled telemedicine application and verified that I am speaking with the correct person using two identifiers. Location patient: Home Location provider: [facility], Office Persons participating in the virtual visit: [place], [place], [place]   I discussed the limitations of evaluation and management by telemedicine and the availability of in person appointments. The patient expressed understanding and agreed to proceed.  Subjective:   HPI:  Diarrhea Pt reports diarrhea, onset Monday night. States continued into Tuesday, resolved by Tuesday night . Had only liquids Wednesday and no diarrhea. States yesterday ate bagel  and crackers and diarrhea returned; 5 xs within 24 hrs. States  stool now watery, had some formed other days. Pt having some LLQ achy ness and nausea, no vomiting or fever.   Denies: dizziness, lightheadedness, low back pain  Dietary recall Tuesday -- oyster crackers and water; biscuit later that night Wednesday -- biscuit in AM, oyster crackers and chicken noodle soup, gingerale  Thursday -- everything bagel with cream cheese  Did have  a colonoscopy about 20 years ago -- for bloody stool and they didn't find anything.   ROS: See pertinent positives and negatives per HPI.  Patient Active Problem List   Diagnosis Date Noted  . Insulin resistance 12/10/2018  . PCOS (polycystic ovarian syndrome) 12/10/2018  . Encounter for contraceptive management 12/10/2018  . Elevated liver function tests 12/10/2018  . Lower extremity edema 12/10/2018  . Vitamin D deficiency 12/10/2018  . B12 deficiency 12/10/2018  . Seasonal allergies 12/10/2018  . Plantar fasciitis 05/02/2018    Social History   Tobacco Use  . Smoking status: Never Smoker  . Smokeless tobacco: Never Used  Substance Use Topics  . Alcohol use: Not on file    Current Outpatient Medications:  .   albuterol (PROVENTIL HFA;VENTOLIN HFA) 108 (90 Base) MCG/ACT inhaler, Inhale 1 puff into the lungs every 6 (six) hours as needed for wheezing or shortness of breath., Disp: 1 Inhaler, Rfl: 0 .  Ascorbic Acid (VITAMIN C) 1000 MG tablet, Take 1,000 mg by mouth daily., Disp: , Rfl:  .  azelastine (ASTELIN) 0.1 % nasal spray, Place 2 sprays into both nostrils 2 (two) times daily., Disp: 30 mL, Rfl: 2 .  b complex vitamins tablet, Take 1 tablet by mouth daily., Disp: , Rfl:  .  Budesonide (PULMICORT FLEXHALER) 90 MCG/ACT inhaler, Inhale 1 puff into the lungs 2 (two) times daily., Disp: 1 Inhaler, Rfl: 2 .  Cholecalciferol (VITAMIN D3) 1000 units CAPS, Take by mouth., Disp: , Rfl:  .  clotrimazole-betamethasone (LOTRISONE) cream, Apply 1 application topically 2 (two) times daily., Disp: 30 g, Rfl: 0 .  diclofenac sodium (VOLTAREN) 1 % GEL, Apply 2 g topically 4 (four) times daily. Rub into affected area of foot 2 to 4 times daily, Disp: 100 g, Rfl: 2 .  famotidine (PEPCID) 20 MG tablet, Take 1 tablet (20 mg total) by mouth 2 (two) times daily., Disp: 90 tablet, Rfl: 3 .  Ferrous Gluconate-C-Folic Acid (IRON-C PO), Take 65 mg by mouth., Disp: , Rfl:  .  Flaxseed, Linseed, (FLAXSEED OIL PO), Take by mouth., Disp: , Rfl:  .  fluticasone (FLONASE) 50 MCG/ACT nasal spray, Place into both nostrils daily., Disp: , Rfl:  .  lansoprazole (PREVACID) 30 MG capsule, Take 1 capsule (30 mg total) by mouth daily at 12 noon., Disp: 90 capsule, Rfl: 1 .  loratadine (  CLARITIN) 10 MG tablet, Take 10 mg by mouth daily., Disp: , Rfl:  .  medroxyPROGESTERone Acetate 150 MG/ML SUSY, , Disp: , Rfl:  .  metFORMIN (GLUCOPHAGE-XR) 500 MG 24 hr tablet, Take 1 tablet (500 mg total) by mouth at bedtime., Disp: 90 tablet, Rfl: 3 .  Multiple Vitamin (MULTIVITAMIN) tablet, Take 1 tablet by mouth daily., Disp: , Rfl:  .  spironolactone (ALDACTONE) 100 MG tablet, Take 1 tablet by mouth once daily, Disp: 30 tablet, Rfl: 4 .  Turmeric 500  MG CAPS, Take by mouth., Disp: , Rfl:  .  ciprofloxacin (CIPRO) 500 MG tablet, Take 1 tablet (500 mg total) by mouth 2 (two) times daily for 7 days., Disp: 14 tablet, Rfl: 0 .  metroNIDAZOLE (FLAGYL) 500 MG tablet, Take 1 tablet (500 mg total) by mouth 3 (three) times daily., Disp: 21 tablet, Rfl: 0 .  ondansetron (ZOFRAN ODT) 4 MG disintegrating tablet, Take 1 tablet (4 mg total) by mouth every 8 (eight) hours as needed for nausea or vomiting., Disp: 20 tablet, Rfl: 0  Allergies  Allergen Reactions  . Biaxin [Clarithromycin]   . Eggs Or Egg-Derived Products     Objective:   VITALS: Per patient if applicable, see vitals. GENERAL: Alert, appears well and in no acute distress. HEENT: Atraumatic, conjunctiva clear, no obvious abnormalities on inspection of external nose and ears. NECK: Normal movements of the head and neck. CARDIOPULMONARY: No increased WOB. Speaking in clear sentences. I:E ratio WNL.  MS: Moves all visible extremities without noticeable abnormality. PSYCH: Pleasant and cooperative, well-groomed. Speech normal rate and rhythm. Affect is appropriate. Insight and judgement are appropriate. Attention is focused, linear, and appropriate.  NEURO: CN grossly intact. Oriented as arrived to appointment on time with no prompting. Moves both UE equally.  SKIN: No obvious lesions, wounds, erythema, or cyanosis noted on face or hands.  Assessment and Plan:   Denise Keller was seen today for diarrhea.  Diagnoses and all orders for this visit:  Diarrhea, unspecified type No red flags on exam. I do have suspicion for diverticulitis given LLQ pain and ongoing issues with eating. Recommended clear liquid diet, zofran prn, Cipro and Flagyl. I also told patient to hold her Metformin until she follows up with PCP, Dr. Earlene Plater, on Monday -- which we made an appointment for while on our call today. ER precautions advised.   Other orders -     ondansetron (ZOFRAN ODT) 4 MG disintegrating tablet;  Take 1 tablet (4 mg total) by mouth every 8 (eight) hours as needed for nausea or vomiting. -     ciprofloxacin (CIPRO) 500 MG tablet; Take 1 tablet (500 mg total) by mouth 2 (two) times daily for 7 days. -     metroNIDAZOLE (FLAGYL) 500 MG tablet; Take 1 tablet (500 mg total) by mouth 3 (three) times daily.    . Reviewed expectations re: course of current medical issues. . Discussed self-management of symptoms. . Outlined signs and symptoms indicating need for more acute intervention. . Patient verbalized understanding and all questions were answered. Marland Kitchen Health Maintenance issues including appropriate healthy diet, exercise, and smoking avoidance were discussed with patient. . See orders for this visit as documented in the electronic medical record.  I discussed the assessment and treatment plan with the patient. The patient was provided an opportunity to ask questions and all were answered. The patient agreed with the plan and demonstrated an understanding of the instructions.   The patient was advised to call back or  seek an in-person evaluation if the symptoms worsen or if the condition fails to improve as anticipated.  CMA or LPN served as scribe during this visit. History, Physical, and Plan performed by medical provider. The above documentation has been reviewed and is accurate and complete.    NicolausSamantha Latham Kinzler, [place] 04/14/2019

## 2019-04-14 NOTE — Telephone Encounter (Signed)
Attempted to call pt. Unable to leave vm.

## 2019-04-14 NOTE — Telephone Encounter (Signed)
Offer virtual appointment today with [name] or Denise Keller OR me a 12:40 if she seems upset if not me.

## 2019-04-16 NOTE — Progress Notes (Addendum)
Virtual Visit via Video   I connected with Denise Keller by a video enabled telemedicine application and verified that I am speaking with the correct person using two identifiers. Location patient: Home Location provider: [facility], Office Persons participating in the virtual visit: Netty, [place]ull[place], DO Barnie Mort, CMA acting as scribe for Dr. Helane Rima.   I discussed the limitations of evaluation and management by telemedicine and the availability of in person appointments. The patient expressed understanding and agreed to proceed.  Subjective:   HPI: No abdominal pain but has has some abdominal discomfort. She was flushed on the day that symptoms started. She thinks that she may had some exposure to Covid-19 at work. She is not able to use mask due to asthma.   From office note with Jarold Motto, PA on 04/23/2019: Diarrhea, unspecified type No red flags on exam. I do have suspicion for diverticulitis given LLQ pain and ongoing issues with eating. Recommended clear liquid diet, zofran prn, Cipro and Flagyl. I also told patient to hold her Metformin until she follows up with PCP, Dr. Earlene Plater, on Monday -- which we made an appointment for while on our call today. ER precautions advised.  ROS: See pertinent positives and negatives per HPI.  Patient Active Problem List   Diagnosis Date Noted  . Insulin resistance 12/10/2018  . PCOS (polycystic ovarian syndrome) 12/10/2018  . Encounter for contraceptive management 12/10/2018  . Elevated liver function tests 12/10/2018  . Lower extremity edema 12/10/2018  . Vitamin D deficiency 12/10/2018  . B12 deficiency 12/10/2018  . Seasonal allergies 12/10/2018  . Plantar fasciitis 05/02/2018    Social History   Tobacco Use  . Smoking status: Never Smoker  . Smokeless tobacco: Never Used  Substance Use Topics  . Alcohol use: Not on file    Current Outpatient Medications:  .  albuterol (PROVENTIL HFA;VENTOLIN  HFA) 108 (90 Base) MCG/ACT inhaler, Inhale 1 puff into the lungs every 6 (six) hours as needed for wheezing or shortness of breath., Disp: 1 Inhaler, Rfl: 0 .  Ascorbic Acid (VITAMIN C) 1000 MG tablet, Take 1,000 mg by mouth daily., Disp: , Rfl:  .  azelastine (ASTELIN) 0.1 % nasal spray, Place 2 sprays into both nostrils 2 (two) times daily., Disp: 30 mL, Rfl: 2 .  b complex vitamins tablet, Take 1 tablet by mouth daily., Disp: , Rfl:  .  Budesonide (PULMICORT FLEXHALER) 90 MCG/ACT inhaler, Inhale 1 puff into the lungs 2 (two) times daily., Disp: 1 Inhaler, Rfl: 2 .  Cholecalciferol (VITAMIN D3) 1000 units CAPS, Take by mouth., Disp: , Rfl:  .  ciprofloxacin (CIPRO) 500 MG tablet, Take 1 tablet (500 mg total) by mouth 2 (two) times daily for 7 days., Disp: 14 tablet, Rfl: 0 .  clotrimazole-betamethasone (LOTRISONE) cream, Apply 1 application topically 2 (two) times daily., Disp: 30 g, Rfl: 0 .  diclofenac sodium (VOLTAREN) 1 % GEL, Apply 2 g topically 4 (four) times daily. Rub into affected area of foot 2 to 4 times daily, Disp: 100 g, Rfl: 2 .  famotidine (PEPCID) 20 MG tablet, Take 1 tablet (20 mg total) by mouth 2 (two) times daily., Disp: 90 tablet, Rfl: 3 .  Ferrous Gluconate-C-Folic Acid (IRON-C PO), Take 65 mg by mouth., Disp: , Rfl:  .  Flaxseed, Linseed, (FLAXSEED OIL PO), Take by mouth., Disp: , Rfl:  .  fluticasone (FLONASE) 50 MCG/ACT nasal spray, Place into both nostrils daily., Disp: , Rfl:  .  lansoprazole (  PREVACID) 30 MG capsule, Take 1 capsule (30 mg total) by mouth daily at 12 noon., Disp: 90 capsule, Rfl: 1 .  loratadine (CLARITIN) 10 MG tablet, Take 10 mg by mouth daily., Disp: , Rfl:  .  medroxyPROGESTERone Acetate 150 MG/ML SUSY, , Disp: , Rfl:  .  metFORMIN (GLUCOPHAGE-XR) 500 MG 24 hr tablet, Take 1 tablet (500 mg total) by mouth at bedtime., Disp: 90 tablet, Rfl: 3 .  metroNIDAZOLE (FLAGYL) 500 MG tablet, Take 1 tablet (500 mg total) by mouth 3 (three) times daily., Disp:  21 tablet, Rfl: 0 .  Multiple Vitamin (MULTIVITAMIN) tablet, Take 1 tablet by mouth daily., Disp: , Rfl:  .  ondansetron (ZOFRAN ODT) 4 MG disintegrating tablet, Take 1 tablet (4 mg total) by mouth every 8 (eight) hours as needed for nausea or vomiting., Disp: 20 tablet, Rfl: 0 .  spironolactone (ALDACTONE) 100 MG tablet, Take 1 tablet by mouth once daily, Disp: 30 tablet, Rfl: 4 .  Turmeric 500 MG CAPS, Take by mouth., Disp: , Rfl:   Allergies  Allergen Reactions  . Biaxin [Clarithromycin]   . Eggs Or Egg-Derived Products    Objective:   VITALS: Per patient if applicable, see vitals. GENERAL: Alert, appears well and in no acute distress. HEENT: Atraumatic, conjunctiva clear, no obvious abnormalities on inspection of external nose and ears. NECK: Normal movements of the head and neck. CARDIOPULMONARY: No increased WOB. Speaking in clear sentences. I:E ratio WNL.  MS: Moves all visible extremities without noticeable abnormality. PSYCH: Pleasant and cooperative, well-groomed. Speech normal rate and rhythm. Affect is appropriate. Insight and judgement are appropriate. Attention is focused, linear, and appropriate.  NEURO: CN grossly intact. Oriented as arrived to appointment on time with no prompting. Moves both UE equally.  SKIN: No obvious lesions, wounds, erythema, or cyanosis noted on face or hands.  Assessment and Plan:   Denise Keller was seen today for diarrhea.  Diagnoses and all orders for this visit:  Suspected Covid-19 Virus Infection Comments: With exposure and continued symptoms, concern for COVID. Unable to test. Advised to isolate x 7-14 days.  Educated About Covid-19 Virus Infection  Exposure to Covid-19 Virus   . Reviewed expectations re: course of current medical issues. . Discussed self-management of symptoms. . Outlined signs and symptoms indicating need for more acute intervention. . Patient verbalized understanding and all questions were answered. [facility]  Maintenance issues including appropriate healthy diet, exercise, and smoking avoidance were discussed with patient. . See orders for this visit as documented in the electronic medical record.  Helane Rima, DO  Records requested if needed. Time spent: 25 minutes, of which >50% was spent in obtaining information about her symptoms, reviewing her previous labs, evaluations, and treatments, counseling her about her condition (please see the discussed topics above), and developing a plan to further investigate it; she had a number of questions which I addressed.

## 2019-04-17 ENCOUNTER — Other Ambulatory Visit: Payer: Self-pay

## 2019-04-17 ENCOUNTER — Ambulatory Visit (INDEPENDENT_AMBULATORY_CARE_PROVIDER_SITE_OTHER): Payer: BLUE CROSS/BLUE SHIELD | Admitting: Family Medicine

## 2019-04-17 ENCOUNTER — Telehealth: Payer: Self-pay | Admitting: Family Medicine

## 2019-04-17 VITALS — Ht 62.0 in | Wt 254.0 lb

## 2019-04-17 DIAGNOSIS — Z7189 Other specified counseling: Secondary | ICD-10-CM

## 2019-04-17 DIAGNOSIS — Z20828 Contact with and (suspected) exposure to other viral communicable diseases: Secondary | ICD-10-CM

## 2019-04-17 DIAGNOSIS — R6889 Other general symptoms and signs: Secondary | ICD-10-CM | POA: Diagnosis not present

## 2019-04-17 DIAGNOSIS — Z20822 Contact with and (suspected) exposure to covid-19: Secondary | ICD-10-CM

## 2019-04-17 NOTE — Telephone Encounter (Signed)
Copied from CRM [id number]. Topic: General - Other >> Apr 17, 2019  3:06 PM Jaquita Rector A wrote: Reason for CRM: Patient called to say that when she had her visit with Dr Earlene Plater this AM she was told that she would get a call back this afternoon with some information. She was just inquiring if there was any updates. Please advise Ph# [phone]

## 2019-04-18 ENCOUNTER — Telehealth: Payer: Self-pay | Admitting: Family Medicine

## 2019-04-18 NOTE — Telephone Encounter (Signed)
Copied from CRM [id number]. Topic: General - Other >> Apr 18, 2019 12:38 PM Marylen Ponto wrote: Reason for CRM: Pt stated she spoke with her insurance company and they will cover the covid-19 testing. Pt would like a call back to advise where she can go to be tested.

## 2019-04-18 NOTE — Telephone Encounter (Signed)
Called patient yesterday about 4:00 and reviewed information

## 2019-04-19 ENCOUNTER — Encounter: Payer: Self-pay | Admitting: Family Medicine

## 2019-04-19 NOTE — Telephone Encounter (Signed)
Called patient let her know that we can not do covid testing here. It was the [facility] Testing that she was to check on. She will call insurance company back and send my chart if covered.

## 2019-04-20 ENCOUNTER — Encounter: Payer: Self-pay | Admitting: Family Medicine

## 2019-04-20 DIAGNOSIS — Z20828 Contact with and (suspected) exposure to other viral communicable diseases: Secondary | ICD-10-CM | POA: Diagnosis not present

## 2019-04-20 DIAGNOSIS — R05 Cough: Secondary | ICD-10-CM | POA: Diagnosis not present

## 2019-04-20 NOTE — Telephone Encounter (Signed)
Called patient she had drive up testing  Information. She will go have test done and let us know results.

## 2019-04-20 NOTE — Patient Instructions (Signed)

## 2019-04-20 NOTE — Telephone Encounter (Signed)
See note

## 2019-04-20 NOTE — Telephone Encounter (Signed)
Patient called and would like a all back about getting a order/referral for coronavirus testing elsewhere. Please call patient back, thnaks.

## 2019-04-21 NOTE — Telephone Encounter (Signed)
Called and talked to patient. Have addressed all issues.

## 2019-04-21 NOTE — Telephone Encounter (Signed)
See note  Copied from CRM [id number]. Topic: General - Other >> Apr 21, 2019  9:33 AM Jaquita Rector A wrote: Reason for CRM: Patient called back for JoEllen asking for a call back at Ph# [phone]

## 2019-04-25 ENCOUNTER — Telehealth: Payer: Self-pay | Admitting: Family Medicine

## 2019-04-25 MED ORDER — FLUCONAZOLE 100 MG PO TABS: 100.0000 mg | ORAL_TABLET | Freq: Every day | ORAL | 0 refills | Status: AC

## 2019-04-25 NOTE — Addendum Note (Signed)
Addended by: Helane Rima R on: 04/25/2019 02:32 PM   Modules accepted: Orders

## 2019-04-25 NOTE — Telephone Encounter (Signed)
I sent in Diflucan 100 mg po daily x 3 days.

## 2019-04-25 NOTE — Telephone Encounter (Signed)
Patient called stating she wanted to discuss the antibiotics for the previous diagnosis for a yeast infection, she thinks it is a perineum infection because she has had itching in the perineum area.   I offered an appointment to be seen virtually for the issue however, the patient stated she was just seen on 04/14/19 and 04/17/19 for the issue (see 04/13/19 triage note also) and each time it costs her $200 due to not meeting her deductible yet. She requests a call from the clinical team to discuss rather than a virtual visit due the the financial strain.   Please contact patient to advise.

## 2019-04-25 NOTE — Telephone Encounter (Signed)
Called and spoke with patient. She reports that she's had diarrhea for the past 2 weeks. Denise Keller put her on abx for diverticulitis. She reports hx of vaginal yeast infections with abx use. She thinks that she may actually have a yeast infection in her perineal/anal region. She c/o extreme itching. She had an old Diflucan that expired in January that she took Sunday night. She hasn't noticed much of a difference yet.   She would like to know if Denise Keller would be willing to send in rx for Diflucan.   She advised that she is heading into work and will be unable to answer her phone. She requests that detailed message be left on her VM with Denise Keller response.

## 2019-05-15 ENCOUNTER — Encounter: Payer: Self-pay | Admitting: Family Medicine

## 2019-05-17 ENCOUNTER — Other Ambulatory Visit: Payer: Self-pay

## 2019-05-17 MED ORDER — MEDROXYPROGESTERONE ACETATE 150 MG/ML IM SUSY: 150.0000 mg | PREFILLED_SYRINGE | INTRAMUSCULAR | 2 refills | Status: AC

## 2019-05-19 ENCOUNTER — Ambulatory Visit: Payer: BLUE CROSS/BLUE SHIELD | Admitting: Family Medicine

## 2019-05-19 DIAGNOSIS — Z3042 Encounter for surveillance of injectable contraceptive: Secondary | ICD-10-CM | POA: Diagnosis not present

## 2019-05-28 ENCOUNTER — Other Ambulatory Visit: Payer: Self-pay | Admitting: Podiatry

## 2019-07-03 ENCOUNTER — Other Ambulatory Visit: Payer: Self-pay

## 2019-07-03 MED ORDER — ALBUTEROL SULFATE HFA 108 (90 BASE) MCG/ACT IN AERS: 1.0000 | INHALATION_SPRAY | Freq: Four times a day (QID) | RESPIRATORY_TRACT | 1 refills | Status: AC | PRN

## 2019-09-06 ENCOUNTER — Other Ambulatory Visit: Payer: Self-pay | Admitting: Family Medicine

## 2019-10-09 ENCOUNTER — Other Ambulatory Visit: Payer: Self-pay | Admitting: Family Medicine

## 2020-08-04 ENCOUNTER — Other Ambulatory Visit: Payer: Self-pay | Admitting: Family Medicine
